# Patient Record
Sex: Male | Born: 1965 | Race: White | Hispanic: No | Marital: Married | Smoking: Current every day smoker
Health system: Southern US, Community
[De-identification: ages and names within clinical notes are randomized; demographics above are authoritative.]

## PROBLEM LIST (undated history)

## (undated) DIAGNOSIS — R7303 Prediabetes: Secondary | ICD-10-CM

## (undated) DIAGNOSIS — T8859XA Other complications of anesthesia, initial encounter: Secondary | ICD-10-CM

## (undated) DIAGNOSIS — T7840XA Allergy, unspecified, initial encounter: Secondary | ICD-10-CM

## (undated) HISTORY — PX: TONSILLECTOMY: SUR1361

## (undated) HISTORY — PX: WISDOM TOOTH EXTRACTION: SHX21

## (undated) HISTORY — DX: Allergy, unspecified, initial encounter: T78.40XA

## (undated) HISTORY — PX: FINGER FRACTURE SURGERY: SHX638

---

## 2011-01-10 ENCOUNTER — Emergency Department: Payer: Self-pay | Admitting: *Deleted

## 2011-06-02 ENCOUNTER — Ambulatory Visit: Payer: Self-pay | Admitting: Family Medicine

## 2011-07-28 ENCOUNTER — Ambulatory Visit: Payer: Self-pay

## 2015-06-05 ENCOUNTER — Inpatient Hospital Stay
Admission: EM | Admit: 2015-06-05 | Discharge: 2015-06-09 | DRG: 494 | Disposition: A | Discharge status: Home / Self Care | Payer: Worker's Compensation | Attending: Orthopedic Surgery | Admitting: Orthopedic Surgery

## 2015-06-05 ENCOUNTER — Inpatient Hospital Stay: Payer: Worker's Compensation

## 2015-06-05 ENCOUNTER — Emergency Department: Payer: Worker's Compensation

## 2015-06-05 DIAGNOSIS — S82201A Unspecified fracture of shaft of right tibia, initial encounter for closed fracture: Secondary | ICD-10-CM | POA: Diagnosis present

## 2015-06-05 DIAGNOSIS — S82401A Unspecified fracture of shaft of right fibula, initial encounter for closed fracture: Secondary | ICD-10-CM | POA: Diagnosis present

## 2015-06-05 DIAGNOSIS — F17200 Nicotine dependence, unspecified, uncomplicated: Secondary | ICD-10-CM | POA: Diagnosis present

## 2015-06-05 DIAGNOSIS — Y9389 Activity, other specified: Secondary | ICD-10-CM

## 2015-06-05 DIAGNOSIS — X58XXXA Exposure to other specified factors, initial encounter: Secondary | ICD-10-CM | POA: Diagnosis present

## 2015-06-05 DIAGNOSIS — M25579 Pain in unspecified ankle and joints of unspecified foot: Secondary | ICD-10-CM | POA: Diagnosis present

## 2015-06-05 DIAGNOSIS — Z419 Encounter for procedure for purposes other than remedying health state, unspecified: Secondary | ICD-10-CM

## 2015-06-05 LAB — COMPREHENSIVE METABOLIC PANEL
ALBUMIN: 4.3 g/dL (ref 3.5–5.0)
ALT: 40 U/L (ref 17–63)
ANION GAP: 9 (ref 5–15)
AST: 32 U/L (ref 15–41)
Alkaline Phosphatase: 83 U/L (ref 38–126)
BUN: 9 mg/dL (ref 6–20)
CO2: 24 mmol/L (ref 22–32)
Calcium: 9 mg/dL (ref 8.9–10.3)
Chloride: 105 mmol/L (ref 101–111)
Creatinine, Ser: 0.78 mg/dL (ref 0.61–1.24)
GFR calc Af Amer: >60 mL/min (ref >60)
GFR calc non Af Amer: >60 mL/min (ref >60)
GLUCOSE: 103 mg/dL — AB (ref 65–99)
POTASSIUM: 3.5 mmol/L (ref 3.5–5.1)
SODIUM: 138 mmol/L (ref 135–145)
Total Bilirubin: 0.4 mg/dL (ref 0.3–1.2)
Total Protein: 7.4 g/dL (ref 6.5–8.1)

## 2015-06-05 LAB — CBC WITH DIFFERENTIAL/PLATELET
BASOS ABS: 0.1 10*3/uL (ref 0–0.1)
BASOS PCT: 1 %
EOS ABS: 0.4 10*3/uL (ref 0–0.7)
Eosinophils Relative: 3 %
HCT: 45.5 % (ref 40.0–52.0)
HEMOGLOBIN: 15.8 g/dL (ref 13.0–18.0)
Lymphocytes Relative: 34 %
Lymphs Abs: 4.8 10*3/uL — ABNORMAL HIGH (ref 1.0–3.6)
MCH: 31.2 pg (ref 26.0–34.0)
MCHC: 34.7 g/dL (ref 32.0–36.0)
MCV: 90.1 fL (ref 80.0–100.0)
MONOS PCT: 7 %
Monocytes Absolute: 1 10*3/uL (ref 0.2–1.0)
NEUTROS PCT: 55 %
Neutro Abs: 7.9 10*3/uL — ABNORMAL HIGH (ref 1.4–6.5)
Platelets: 284 10*3/uL (ref 150–440)
RBC: 5.05 MIL/uL (ref 4.40–5.90)
RDW: 12.9 % (ref 11.5–14.5)
WBC: 14.2 10*3/uL — ABNORMAL HIGH (ref 3.8–10.6)

## 2015-06-05 LAB — APTT: aPTT: 29 seconds (ref 24–36)

## 2015-06-05 LAB — TYPE AND SCREEN
ABO/RH(D): O POS
ANTIBODY SCREEN: NEGATIVE

## 2015-06-05 LAB — PROTIME-INR
INR: 0.98
PROTHROMBIN TIME: 13.2 s (ref 11.4–15.0)

## 2015-06-05 LAB — ABO/RH: ABO/RH(D): O POS

## 2015-06-05 MED ORDER — CEFAZOLIN SODIUM-DEXTROSE 2-3 GM-% IV SOLR
2.0000 g | INTRAVENOUS | Status: AC
  Administered 2015-06-06: 2 g via INTRAVENOUS
  Filled 2015-06-05: qty 50

## 2015-06-05 MED ORDER — ONDANSETRON HCL 4 MG/2ML IJ SOLN
INTRAMUSCULAR | Status: AC
  Filled 2015-06-05: qty 2

## 2015-06-05 MED ORDER — HYDROMORPHONE HCL 1 MG/ML IJ SOLN
1.0000 mg | INTRAMUSCULAR | Status: DC | PRN
  Administered 2015-06-06 (×4): 1 mg via INTRAVENOUS
  Filled 2015-06-05 (×4): qty 1

## 2015-06-05 MED ORDER — HYDROMORPHONE HCL 1 MG/ML IJ SOLN
INTRAMUSCULAR | Status: AC
  Filled 2015-06-05: qty 1

## 2015-06-05 MED ORDER — HYDROMORPHONE HCL 1 MG/ML IJ SOLN
1.0000 mg | Freq: Once | INTRAMUSCULAR | Status: AC
  Administered 2015-06-05: 1 mg via INTRAVENOUS
  Filled 2015-06-05: qty 1

## 2015-06-05 MED ORDER — HYDROMORPHONE HCL 1 MG/ML IJ SOLN
1.0000 mg | Freq: Once | INTRAMUSCULAR | Status: AC
  Administered 2015-06-05: 1 mg via INTRAVENOUS

## 2015-06-05 MED ORDER — ONDANSETRON HCL 4 MG/2ML IJ SOLN
4.0000 mg | Freq: Once | INTRAMUSCULAR | Status: AC
  Administered 2015-06-05: 4 mg via INTRAVENOUS

## 2015-06-05 MED ORDER — OXYCODONE HCL 5 MG PO TABS
10.0000 mg | ORAL_TABLET | ORAL | Status: DC | PRN
  Administered 2015-06-06: 15 mg via ORAL
  Filled 2015-06-05: qty 3

## 2015-06-05 MED ORDER — ACETAMINOPHEN 325 MG PO TABS: 650.0000 mg | ORAL_TABLET | Freq: Four times a day (QID) | ORAL | Status: DC | PRN

## 2015-06-05 MED ORDER — HYDROMORPHONE HCL 1 MG/ML IJ SOLN
INTRAMUSCULAR | Status: AC
  Administered 2015-06-05: 1 mg via INTRAVENOUS
  Filled 2015-06-05: qty 1

## 2015-06-05 MED ORDER — SODIUM CHLORIDE 0.9 % IV SOLN
INTRAVENOUS | Status: DC
  Administered 2015-06-06 (×2): via INTRAVENOUS

## 2015-06-05 NOTE — ED Notes (Signed)
Pt via EMS, pt works for [employer] and was chasing suspect when he climbed over fence and fell into a hole, obvious deformity to R tib/fib

## 2015-06-05 NOTE — ED Provider Notes (Signed)
[facility] Emergency Department Provider Note  ____________________________________________   I have reviewed the triage vital signs and the nursing notes.   HISTORY  Chief Complaint Leg Injury    HPI Tommy [name] is a 50 y.o. male who is healthy, he was chasing a suspect jumped over fence and fell to the hole, he did not injure any part of his body except for his right tib-fib. Did not pass out did not hit his head.He has obvious injury to the right tib-fib region. He is noted. Did get pain medication from EMS. No numbness or weakness. No knee pain. No hip pain.  History reviewed. No pertinent past medical history.  There are no active problems to display for this patient.   History reviewed. No pertinent past surgical history.  No current outpatient prescriptions on file.  Allergies Review of patient's allergies indicates no known allergies.  No family history on file.  Social History Social History  Substance Use Topics  . Smoking status: Current Every Day Smoker  . Smokeless tobacco: None  . Alcohol Use: No    Review of Systems See history of present illness otherwise negative  ____________________________________________   PHYSICAL EXAM:  VITAL SIGNS: ED Triage Vitals  Enc Vitals Group     BP 06/05/15 2112 158/93 mmHg     Pulse Rate 06/05/15 2114 97     Resp 06/05/15 2114 18     Temp 06/05/15 2112 98.1 F (36.7 C)     Temp src --      SpO2 06/05/15 2114 97 %     Weight 06/05/15 2107 192 lb (87.091 kg)     Height 06/05/15 2107 5\' 9"  (1.753 m)     Head Cir --      Peak Flow --      Pain Score 06/05/15 2107 7     Pain Loc --      Pain Edu? --      Excl. in [place]? --     Constitutional: Alert and oriented. Well appearing and obvious discomfort but not otherwise unwell Eyes: Conjunctivae are normal. PERRL. EOMI. Head: Atraumatic. Nose: No congestion/rhinnorhea. Mouth/Throat: Mucous membranes are moist.  Oropharynx  non-erythematous. Neck: No stridor.   Nontender with no meningismus Cardiovascular: Normal rate, regular rhythm. Grossly normal heart sounds.  Good peripheral circulation. Respiratory: Normal respiratory effort.  No retractions. Lungs CTAB. Abdominal: Soft and nontender. No distention. No guarding no rebound Back:  There is no focal tenderness or step off there is no midline tenderness there are no lesions noted. there is no CVA tenderness Musculoskeletal: Obvious deformity to the right lower extremity in the mid shaft of the femur with no tenting of the skin or break in the skin. Strong 2+ distal pulses and DP posterior tibial with good cap refill, compartment a No joint effusions, no DVT signs strong distal pulses no edema Neurologic:  Normal speech and language. No gross focal neurologic deficits are appreciated.  Skin:  Skin is warm, dry and intact. No rash noted. Psychiatric: Mood and affect are normal. Speech and behavior are normal.  ____________________________________________   LABS (all labs ordered are listed, but only abnormal results are displayed)  Labs Reviewed - No data to display ____________________________________________  EKG  I personally interpreted any EKGs ordered by me or triage  ____________________________________________  RADIOLOGY  I reviewed any imaging ordered by me or triage that were performed during my shift ____________________________________________   PROCEDURES  Procedure(s) performed: Splint placement personally supervised  by me or vascular intact with no comp complications afterwards.  Critical Care performed: None  ____________________________________________   INITIAL IMPRESSION / ASSESSMENT AND PLAN / ED COURSE  Pertinent labs & imaging results that were available during my care of the patient were reviewed by me and considered in my medical decision making (see chart for details).  Patient had a nonstick will follow with an  obvious injury. We did give him pain medication, and he is pain is very well controlled at this time. We did splint the patient and he is very much more comfortable at this time. Neurovascular intact. We did discuss with Dr. Christia Reading, of orthopedic surgery who agreed with splitting the patient and who will admit the patient for pain control and operation tomorrow. Patient remains neurovascular intact after splinting. ____________________________________________   FINAL CLINICAL IMPRESSION(S) / ED DIAGNOSES  Final diagnoses:  None      This chart was dictated using voice recognition software.  Despite best efforts to proofread,  errors can occur which can change meaning.     Schuyler Amor, MD 06/05/15 2201

## 2015-06-05 NOTE — ED Notes (Signed)
Completing UDS for workers comp before transport

## 2015-06-05 NOTE — ED Notes (Signed)
Radiology called to request that last ordered RIGHT tib/fib film be discontinued after speaking with Burlene Arnt, MD and new studies being ordered by MD. Order discontinued per [facility]ORB at this time.

## 2015-06-06 ENCOUNTER — Inpatient Hospital Stay: Payer: Worker's Compensation | Admitting: Certified Registered Nurse Anesthetist

## 2015-06-06 ENCOUNTER — Inpatient Hospital Stay: Payer: Worker's Compensation

## 2015-06-06 ENCOUNTER — Encounter
Admission: EM | Disposition: A | Discharge status: Home / Self Care | Payer: Self-pay | Source: Home / Self Care | Attending: Orthopedic Surgery

## 2015-06-06 HISTORY — PX: TIBIA IM NAIL INSERTION: SHX2516

## 2015-06-06 LAB — BASIC METABOLIC PANEL
Anion gap: 12 (ref 5–15)
BUN: 8 mg/dL (ref 6–20)
CALCIUM: 8.7 mg/dL — AB (ref 8.9–10.3)
CHLORIDE: 103 mmol/L (ref 101–111)
CO2: 23 mmol/L (ref 22–32)
CREATININE: 0.85 mg/dL (ref 0.61–1.24)
GFR calc non Af Amer: >60 mL/min (ref >60)
GLUCOSE: 111 mg/dL — AB (ref 65–99)
Potassium: 3.9 mmol/L (ref 3.5–5.1)
Sodium: 138 mmol/L (ref 135–145)

## 2015-06-06 LAB — CBC
HCT: 43.1 % (ref 40.0–52.0)
HEMOGLOBIN: 15 g/dL (ref 13.0–18.0)
MCH: 31.8 pg (ref 26.0–34.0)
MCHC: 34.9 g/dL (ref 32.0–36.0)
MCV: 91 fL (ref 80.0–100.0)
Platelets: 266 10*3/uL (ref 150–440)
RBC: 4.74 MIL/uL (ref 4.40–5.90)
RDW: 12.9 % (ref 11.5–14.5)
WBC: 14.5 10*3/uL — ABNORMAL HIGH (ref 3.8–10.6)

## 2015-06-06 LAB — SURGICAL PCR SCREEN
MRSA, PCR: NEGATIVE
Staphylococcus aureus: NEGATIVE

## 2015-06-06 SURGERY — INSERTION, INTRAMEDULLARY ROD, TIBIA
Anesthesia: Choice | Laterality: Right

## 2015-06-06 MED ORDER — LACTATED RINGERS IV SOLN
INTRAVENOUS | Status: DC | PRN
  Administered 2015-06-06: 10:00:00 via INTRAVENOUS

## 2015-06-06 MED ORDER — OXYCODONE HCL 5 MG PO TABS
15.0000 mg | ORAL_TABLET | ORAL | Status: AC
  Administered 2015-06-06: 15 mg via ORAL
  Filled 2015-06-06: qty 3

## 2015-06-06 MED ORDER — METHOCARBAMOL 1000 MG/10ML IJ SOLN
500.0000 mg | Freq: Four times a day (QID) | INTRAVENOUS | Status: DC | PRN
  Filled 2015-06-06: qty 5

## 2015-06-06 MED ORDER — OXYCODONE HCL 5 MG PO TABS
10.0000 mg | ORAL_TABLET | ORAL | Status: DC | PRN
  Administered 2015-06-06 (×2): 10 mg via ORAL
  Administered 2015-06-07: 15 mg via ORAL
  Administered 2015-06-07: 10 mg via ORAL
  Administered 2015-06-08 – 2015-06-09 (×8): 15 mg via ORAL
  Administered 2015-06-09 (×3): 10 mg via ORAL
  Filled 2015-06-06 (×2): qty 3
  Filled 2015-06-06: qty 10
  Filled 2015-06-06: qty 3
  Filled 2015-06-06: qty 2
  Filled 2015-06-06 (×3): qty 3
  Filled 2015-06-06 (×3): qty 2
  Filled 2015-06-06 (×2): qty 3
  Filled 2015-06-06 (×2): qty 2
  Filled 2015-06-06: qty 15

## 2015-06-06 MED ORDER — ZOLPIDEM TARTRATE 5 MG PO TABS: 5.0000 mg | ORAL_TABLET | Freq: Every evening | ORAL | Status: DC | PRN

## 2015-06-06 MED ORDER — ACETAMINOPHEN 650 MG RE SUPP: 650.0000 mg | Freq: Four times a day (QID) | RECTAL | Status: DC | PRN

## 2015-06-06 MED ORDER — ACETAMINOPHEN 325 MG PO TABS: 650.0000 mg | ORAL_TABLET | Freq: Four times a day (QID) | ORAL | Status: DC | PRN

## 2015-06-06 MED ORDER — KETOROLAC TROMETHAMINE 15 MG/ML IJ SOLN
15.0000 mg | Freq: Four times a day (QID) | INTRAMUSCULAR | Status: AC | PRN
  Administered 2015-06-07 (×3): 15 mg via INTRAVENOUS
  Filled 2015-06-06 (×3): qty 1

## 2015-06-06 MED ORDER — MENTHOL 3 MG MT LOZG
1.0000 | LOZENGE | OROMUCOSAL | Status: DC | PRN
  Filled 2015-06-06: qty 9

## 2015-06-06 MED ORDER — FENTANYL CITRATE (PF) 100 MCG/2ML IJ SOLN
INTRAMUSCULAR | Status: AC
  Administered 2015-06-06: 50 ug via INTRAVENOUS
  Filled 2015-06-06: qty 2

## 2015-06-06 MED ORDER — HYDROMORPHONE HCL 1 MG/ML IJ SOLN
1.0000 mg | INTRAMUSCULAR | Status: DC | PRN
  Administered 2015-06-06 – 2015-06-07 (×3): 1 mg via INTRAVENOUS
  Filled 2015-06-06 (×3): qty 1

## 2015-06-06 MED ORDER — METHOCARBAMOL 500 MG PO TABS
500.0000 mg | ORAL_TABLET | Freq: Four times a day (QID) | ORAL | Status: DC | PRN
  Administered 2015-06-06 – 2015-06-08 (×3): 500 mg via ORAL
  Filled 2015-06-06 (×3): qty 1

## 2015-06-06 MED ORDER — ALUM & MAG HYDROXIDE-SIMETH 200-200-20 MG/5ML PO SUSP
30.0000 mL | ORAL | Status: DC | PRN
  Administered 2015-06-07 – 2015-06-09 (×2): 30 mL via ORAL
  Filled 2015-06-06 (×2): qty 30

## 2015-06-06 MED ORDER — MAGNESIUM CITRATE PO SOLN
1.0000 | Freq: Once | ORAL | Status: DC | PRN
  Filled 2015-06-06: qty 296

## 2015-06-06 MED ORDER — CEFAZOLIN SODIUM-DEXTROSE 2-3 GM-% IV SOLR
2.0000 g | Freq: Four times a day (QID) | INTRAVENOUS | Status: AC
  Administered 2015-06-06: 2 g via INTRAVENOUS
  Filled 2015-06-06 (×3): qty 50

## 2015-06-06 MED ORDER — ROCURONIUM BROMIDE 100 MG/10ML IV SOLN
INTRAVENOUS | Status: DC | PRN
  Administered 2015-06-06: 40 mg via INTRAVENOUS
  Administered 2015-06-06: 10 mg via INTRAVENOUS
  Administered 2015-06-06: 20 mg via INTRAVENOUS
  Administered 2015-06-06: 10 mg via INTRAVENOUS

## 2015-06-06 MED ORDER — ACETAMINOPHEN 10 MG/ML IV SOLN
INTRAVENOUS | Status: AC
  Filled 2015-06-06: qty 100

## 2015-06-06 MED ORDER — SODIUM CHLORIDE 0.9 % IV SOLN
75.0000 mL/h | INTRAVENOUS | Status: DC
  Administered 2015-06-06 – 2015-06-07 (×2): 75 mL/h via INTRAVENOUS

## 2015-06-06 MED ORDER — MIDAZOLAM HCL 2 MG/2ML IJ SOLN
INTRAMUSCULAR | Status: DC | PRN
  Administered 2015-06-06: 2 mg via INTRAVENOUS

## 2015-06-06 MED ORDER — SUGAMMADEX SODIUM 200 MG/2ML IV SOLN
INTRAVENOUS | Status: DC | PRN
  Administered 2015-06-06: 200 mg via INTRAVENOUS

## 2015-06-06 MED ORDER — POLYETHYLENE GLYCOL 3350 17 G PO PACK
17.0000 g | PACK | Freq: Every day | ORAL | Status: DC | PRN
  Administered 2015-06-08: 17 g via ORAL
  Filled 2015-06-06: qty 1

## 2015-06-06 MED ORDER — PHENOL 1.4 % MT LIQD
1.0000 | OROMUCOSAL | Status: DC | PRN
Start: 2015-06-06 — End: 2015-06-09
  Filled 2015-06-06: qty 177

## 2015-06-06 MED ORDER — ONDANSETRON HCL 4 MG/2ML IJ SOLN
INTRAMUSCULAR | Status: DC | PRN
  Administered 2015-06-06: 4 mg via INTRAVENOUS

## 2015-06-06 MED ORDER — DOCUSATE SODIUM 100 MG PO CAPS
100.0000 mg | ORAL_CAPSULE | Freq: Two times a day (BID) | ORAL | Status: DC
  Administered 2015-06-06 – 2015-06-09 (×7): 100 mg via ORAL
  Filled 2015-06-06 (×7): qty 1

## 2015-06-06 MED ORDER — FENTANYL CITRATE (PF) 100 MCG/2ML IJ SOLN
INTRAMUSCULAR | Status: DC | PRN
  Administered 2015-06-06: 100 ug via INTRAVENOUS
  Administered 2015-06-06: 50 ug via INTRAVENOUS
  Administered 2015-06-06: 100 ug via INTRAVENOUS

## 2015-06-06 MED ORDER — BUPIVACAINE HCL (PF) 0.25 % IJ SOLN
INTRAMUSCULAR | Status: AC
  Filled 2015-06-06: qty 30

## 2015-06-06 MED ORDER — BISACODYL 10 MG RE SUPP: 10.0000 mg | Freq: Every day | RECTAL | Status: DC | PRN

## 2015-06-06 MED ORDER — ONDANSETRON HCL 4 MG/2ML IJ SOLN
4.0000 mg | Freq: Four times a day (QID) | INTRAMUSCULAR | Status: DC | PRN
  Administered 2015-06-07 (×2): 4 mg via INTRAVENOUS
  Filled 2015-06-06 (×2): qty 2

## 2015-06-06 MED ORDER — FERROUS SULFATE 325 (65 FE) MG PO TABS
325.0000 mg | ORAL_TABLET | Freq: Three times a day (TID) | ORAL | Status: DC
  Administered 2015-06-06 – 2015-06-09 (×8): 325 mg via ORAL
  Filled 2015-06-06 (×9): qty 1

## 2015-06-06 MED ORDER — DIAZEPAM 5 MG/ML IJ SOLN
5.0000 mg | INTRAMUSCULAR | Status: DC | PRN
  Administered 2015-06-06 – 2015-06-08 (×3): 5 mg via INTRAVENOUS
  Filled 2015-06-06 (×4): qty 2

## 2015-06-06 MED ORDER — LIDOCAINE HCL (CARDIAC) 20 MG/ML IV SOLN
INTRAVENOUS | Status: DC | PRN
  Administered 2015-06-06: 100 mg via INTRAVENOUS

## 2015-06-06 MED ORDER — HYDROMORPHONE HCL 1 MG/ML IJ SOLN
1.0000 mg | Freq: Once | INTRAMUSCULAR | Status: AC
Start: 2015-06-06 — End: 2015-06-06
  Administered 2015-06-06: 1 mg via INTRAVENOUS
  Filled 2015-06-06: qty 1

## 2015-06-06 MED ORDER — ENOXAPARIN SODIUM 30 MG/0.3ML ~~LOC~~ SOLN
30.0000 mg | Freq: Two times a day (BID) | SUBCUTANEOUS | Status: DC
  Administered 2015-06-06 – 2015-06-09 (×6): 30 mg via SUBCUTANEOUS
  Filled 2015-06-06 (×6): qty 0.3

## 2015-06-06 MED ORDER — DEXAMETHASONE SODIUM PHOSPHATE 10 MG/ML IJ SOLN
INTRAMUSCULAR | Status: DC | PRN
  Administered 2015-06-06: 10 mg via INTRAVENOUS

## 2015-06-06 MED ORDER — FENTANYL CITRATE (PF) 100 MCG/2ML IJ SOLN
25.0000 ug | INTRAMUSCULAR | Status: DC | PRN
  Administered 2015-06-06 (×2): 50 ug via INTRAVENOUS

## 2015-06-06 MED ORDER — SUCCINYLCHOLINE CHLORIDE 20 MG/ML IJ SOLN
INTRAMUSCULAR | Status: DC | PRN
  Administered 2015-06-06: 100 mg via INTRAVENOUS

## 2015-06-06 MED ORDER — SENNA 8.6 MG PO TABS
1.0000 | ORAL_TABLET | Freq: Two times a day (BID) | ORAL | Status: DC
  Administered 2015-06-06 – 2015-06-09 (×7): 8.6 mg via ORAL
  Filled 2015-06-06 (×7): qty 1

## 2015-06-06 MED ORDER — PROPOFOL 10 MG/ML IV BOLUS
INTRAVENOUS | Status: DC | PRN
  Administered 2015-06-06: 200 mg via INTRAVENOUS

## 2015-06-06 MED ORDER — DIAZEPAM 5 MG/ML IJ SOLN
5.0000 mg | Freq: Once | INTRAMUSCULAR | Status: AC
  Administered 2015-06-06: 5 mg via INTRAVENOUS
  Filled 2015-06-06: qty 2

## 2015-06-06 MED ORDER — ONDANSETRON HCL 4 MG PO TABS: 4.0000 mg | ORAL_TABLET | Freq: Four times a day (QID) | ORAL | Status: DC | PRN

## 2015-06-06 MED ORDER — NEOMYCIN-POLYMYXIN B GU 40-200000 IR SOLN
Status: DC | PRN
Start: 2015-06-06 — End: 2015-06-06
  Administered 2015-06-06: 4 mL

## 2015-06-06 MED ORDER — DIAZEPAM 5 MG/ML IJ SOLN
5.0000 mg | INTRAMUSCULAR | Status: DC | PRN
  Administered 2015-06-06: 5 mg via INTRAVENOUS
  Filled 2015-06-06: qty 2

## 2015-06-06 MED ORDER — PROMETHAZINE HCL 25 MG/ML IJ SOLN: 6.2500 mg | INTRAMUSCULAR | Status: DC | PRN

## 2015-06-06 MED ORDER — BUPIVACAINE HCL (PF) 0.25 % IJ SOLN
INTRAMUSCULAR | Status: DC | PRN
  Administered 2015-06-06: 30 mL

## 2015-06-06 MED ORDER — ACETAMINOPHEN 10 MG/ML IV SOLN
INTRAVENOUS | Status: DC | PRN
  Administered 2015-06-06: 1000 mg via INTRAVENOUS

## 2015-06-06 SURGICAL SUPPLY — 53 items
BANDAGE ELASTIC 4 LF NS (GAUZE/BANDAGES/DRESSINGS) ×3 IMPLANT
BANDAGE ELASTIC 6 LF NS (GAUZE/BANDAGES/DRESSINGS) ×3 IMPLANT
BIT DRILL 3.8X6 NS (BIT) ×3 IMPLANT
BIT DRILL 4.4 NS (BIT) ×3 IMPLANT
BIT DRILL CAL 3.2 LONG (BIT) IMPLANT
BIT DRILL CAL 3.2MM LONG (BIT)
CANISTER SUCT 1200ML W/VALVE (MISCELLANEOUS) ×3 IMPLANT
DRAPE C-ARM XRAY 36X54 (DRAPES) ×3 IMPLANT
DRAPE C-ARMOR (DRAPES) ×3 IMPLANT
DRAPE SHEET LG 3/4 BI-LAMINATE (DRAPES) ×6 IMPLANT
DURAPREP 26ML APPLICATOR (WOUND CARE) ×6 IMPLANT
ELECT REM PT RETURN 9FT ADLT (ELECTROSURGICAL) ×3
ELECTRODE REM PT RTRN 9FT ADLT (ELECTROSURGICAL) ×1 IMPLANT
GAUZE PETRO XEROFOAM 1X8 (MISCELLANEOUS) ×6 IMPLANT
GAUZE SPONGE 4X4 12PLY STRL (GAUZE/BANDAGES/DRESSINGS) ×6 IMPLANT
GLOVE BIO SURGEON STRL SZ8 (GLOVE) ×3 IMPLANT
GLOVE BIOGEL PI IND STRL 9 (GLOVE) ×1 IMPLANT
GLOVE BIOGEL PI INDICATOR 9 (GLOVE) ×2
GLOVE INDICATOR 8.0 STRL GRN (GLOVE) ×3 IMPLANT
GLOVE SURG 9.0 ORTHO LTXF (GLOVE) ×3 IMPLANT
GLOVE SURG XRAY 8.0 LX (GLOVE) ×3 IMPLANT
GOWN STRL REUS TWL 2XL XL LVL4 (GOWN DISPOSABLE) ×3 IMPLANT
GOWN STRL REUS W/ TWL LRG LVL3 (GOWN DISPOSABLE) ×1 IMPLANT
GOWN STRL REUS W/TWL LRG LVL3 (GOWN DISPOSABLE) ×2
GUIDEPIN 3.2X17.5 THRD DISP (PIN) ×3 IMPLANT
GUIDEWIRE BALL NOSE 80CM (WIRE) ×3 IMPLANT
HEMOVAC 400CC 10FR (MISCELLANEOUS) IMPLANT
KIT RM TURNOVER STRD PROC AR (KITS) ×3 IMPLANT
NAIL TIBIAL 10MMX36CM (Nail) ×3 IMPLANT
NEEDLE FILTER BLUNT 18X 1/2SAF (NEEDLE) ×2
NEEDLE FILTER BLUNT 18X1 1/2 (NEEDLE) ×1 IMPLANT
NS IRRIG 1000ML POUR BTL (IV SOLUTION) ×3 IMPLANT
PACK TOTAL KNEE (MISCELLANEOUS) ×3 IMPLANT
PAD ABD DERMACEA PRESS 5X9 (GAUZE/BANDAGES/DRESSINGS) ×12 IMPLANT
PADDING CAST 6X4YD NS (MISCELLANEOUS) ×4
PADDING CAST BLEND 4X4 NS (MISCELLANEOUS) ×6 IMPLANT
PADDING CAST COTTON 6X4 NS (MISCELLANEOUS) ×2 IMPLANT
REAMER ROD DEEP FLUTE 2.5X950 (INSTRUMENTS) ×3 IMPLANT
SCREW ACECAP 40MM (Screw) ×3 IMPLANT
SCREW PROXIMAL DEPUY (Screw) ×2 IMPLANT
SCREW PRXML FT 45X5.5XLCK NS (Screw) ×1 IMPLANT
SET MONITOR QUICK PRESSURE (MISCELLANEOUS) ×6 IMPLANT
SPLINT CAST 1 STEP 4X30 (MISCELLANEOUS) ×6 IMPLANT
SPLINT CAST 1 STEP 5X30 WHT (MISCELLANEOUS) ×6 IMPLANT
STAPLER SKIN PROX 35W (STAPLE) ×3 IMPLANT
SUT ETHILON 4-0 (SUTURE) ×2
SUT ETHILON 4-0 FS2 18XMFL BLK (SUTURE) ×1
SUT MNCRL AB 3-0 PS2 27 (SUTURE) ×3 IMPLANT
SUT VIC AB 0 CT2 27 (SUTURE) ×3 IMPLANT
SUT VIC AB 2-0 CT2 27 (SUTURE) ×3 IMPLANT
SUTURE ETHLN 4-0 FS2 18XMF BLK (SUTURE) ×1 IMPLANT
SYRINGE 10CC LL (SYRINGE) ×3 IMPLANT
TRAY FOLEY CATH SILVER 16FR LF (SET/KITS/TRAYS/PACK) ×3 IMPLANT

## 2015-06-06 NOTE — Anesthesia Preprocedure Evaluation (Signed)
Anesthesia Evaluation    Airway Mallampati: III  TM Distance: <3 FB     Dental   Pulmonary Current Smoker,           Cardiovascular      Neuro/Psych    GI/Hepatic   Endo/Other    Renal/GU      Musculoskeletal   Abdominal   Peds  Hematology   Anesthesia Other Findings   Reproductive/Obstetrics                             Anesthesia Physical Anesthesia Plan  ASA: II  Anesthesia Plan:    Post-op Pain Management:    Induction:   Airway Management Planned:   Additional Equipment:   Intra-op Plan:   Post-operative Plan:   Informed Consent:   Plan Discussed with:   Anesthesia Plan Comments:         Anesthesia Quick Evaluation

## 2015-06-06 NOTE — OR Nursing (Signed)
Patient arrived in SDS writhing in pain.  Face flushed, itching and patient requesting something to comfort him.  Once the surgical staff interviewed patient, he was given sedation/pain meds.  Patient 02 went to 83%. O2 at 3 liters nasal prong initiated with an immediate improvement to 94%. Patient sound asleep when leaving SDS. New IV started in right arm as patient was unable to keep arm straight for fluids, etc.  Dressing was loose also. Removed.

## 2015-06-06 NOTE — Progress Notes (Signed)
Pt. Arrived to unit in severe pain. Dilaudid and oxycodone administered per MAR. Dr. Mack Guise notified and an additional milligram of dilaudid ordered and administered. Dr. Mack Guise came to unit to see pt. And additional orders written. Meds administered per MAR. Ice applied to fractured extremity. Pt. Resting comfortably at this time. Will continue to monitor.

## 2015-06-06 NOTE — H&P (Signed)
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 He jumped a fence while in pursuit landing in a hole causing injury to the right lower leg. He denies other injuries. Patient is currently having severe pain in the right leg. Patient's nurses contacted me about his pain not responding to oxycodone and Dilaudid. She reports she was having severe pain upon arrival on the floor. Patient states that his pain goes in waves of intensity similar to spasms. He denies any numbness or tingling in the right lower extremity. His wife is at the bedside.  History reviewed. No pertinent past medical history. History reviewed. No pertinent past surgical history. Social History   Social History  . Marital Status: Married    Spouse Name: N/A  . Number of Children: N/A  . Years of Education: N/A   Social History Main Topics  . Smoking status: Current Every Day Smoker  . Smokeless tobacco: None  . Alcohol Use: No  . Drug Use: None  . Sexual Activity: Not Asked   Other Topics Concern  . None   Social History Narrative   History reviewed. No pertinent family history. No Known Allergies Prior to Admission medications   Not on File     Positive ROS: All other systems have been reviewed and were otherwise negative with the exception of those mentioned in the HPI and as above.  Physical Exam: General: Alert, Patient in pain. Skin: Skin intact, no lesions within the operative field. Neurologic: Sensation intact distally  MUSCULOSKELETAL: Right lower extremity: Patient's right lower extremity is elevated above the level of his heart on multiple pillows. Patient's bandages were opened with trauma shears so his skin could be visualized. An AO splint is in place. This was also loosened so the patient's skin and leg  compartments could be examined. Patient's skin is intact. His compartments, including anterior, lateral and posterior, remain soft and compressible. He has a 2+ palpable dorsalis pedis pulse. Patient can flex and extend his toes actively. He does not have significant pain with passive stretch of his toes.  Assessment: Right closed tibia and fibula fracture  Plan: I examined the patient for signs of compartment syndrome this evening. His bandage is removed. His compartments remain soft the knee remains neurovascularly intact. There are currently no signs of compartment syndrome. Patient has been given orders for additional narcotic pain medication as well as IV Valium for muscle spasms. He will continue to elevate his right lower extremity and more ice will be applied to his right lower leg. Patient is scheduled for surgery later this morning. I'm recommending intramedullary fixation for his right displaced, spiral tibial fracture. He will continue to receive close neurovascular monitoring. Patient and his wife understood and agreed with this plan.   Thornton Park, MD   06/06/2015 3:57 AM

## 2015-06-06 NOTE — Transfer of Care (Signed)
Immediate Anesthesia Transfer of Care Note  Patient: Tommy [name]  Procedure(s) Performed: Procedure(s): INTRAMEDULLARY (IM) NAIL TIBIAL (Right)  Patient Location: PACU  Anesthesia Type:General  Level of Consciousness: sedated  Airway & Oxygen Therapy: Patient Spontanous Breathing and Patient connected to face mask oxygen  Post-op Assessment: Report given to RN and Post -op Vital signs reviewed and stable  Post vital signs: Reviewed and stable  Last Vitals:  Filed Vitals:   06/06/15 0511 06/06/15 0850  BP: 137/78 135/89  Pulse: 85 84  Temp: 36.4 C 37.2 C  Resp: 18 16    Complications: No apparent anesthesia complications

## 2015-06-06 NOTE — Progress Notes (Signed)
Pt. Still in extreme pain after receiving all pain meds due at this time. Dr. Mack Guise ordered an additional 1mg  of dilaudid.

## 2015-06-06 NOTE — Progress Notes (Signed)
Pt. Having severe muscle spasms. Additional dose of valium 5mg  IV ordered per Dr. Mack Guise.

## 2015-06-06 NOTE — Care Management (Signed)
Patient was not in room when I rounded. List of home health agencies left at bedside. Message left for workers' comp [phone] to discuss patient discharge need. RNCM to continue to follow.

## 2015-06-06 NOTE — Progress Notes (Addendum)
Subjective:  POST OP CHECK:  Patient reports right leg pain as marked.  However clinically she appears more comfortable than he was overnight. Patient is talking to his family and friends in the room. He denies numbness or tingling in the right lower extremity.  Objective:   VITALS:   Filed Vitals:   06/06/15 1356 06/06/15 1439 06/06/15 1506 06/06/15 1631  BP: 123/79 133/81 123/74 120/69  Pulse: 77 80 82 83  Temp: 97.9 F (36.6 C) 98.2 F (36.8 C) 98.2 F (36.8 C) 98.4 F (36.9 C)  TempSrc:  Oral Oral Oral  Resp: 12 18 18 18   Height:      Weight:      SpO2: 96% 95% 92% 94%    PHYSICAL EXAM:  Right lower extremity: Patient's dressing and bandages are clean dry and intact. His right lower extremity is elevated on pillows. His toes are well-perfused. He has intact sensation to light touch in all 5 toes. He can flex and extend his toes gently without significant pain and demonstrates no significant pain with passive stretch of his toes on the right foot.   LABS  Results for orders placed or performed during the hospital encounter of 06/05/15 (from the past 24 hour(s))  Protime-INR     Status: None   Collection Time: 06/05/15  9:08 PM  Result Value Ref Range   Prothrombin Time 13.2 11.4 - 15.0 seconds   INR 0.98   CBC WITH DIFFERENTIAL     Status: Abnormal   Collection Time: 06/05/15  9:08 PM  Result Value Ref Range   WBC 14.2 (H) 3.8 - 10.6 K/uL   RBC 5.05 4.40 - 5.90 MIL/uL   Hemoglobin 15.8 13.0 - 18.0 g/dL   HCT 45.5 40.0 - 52.0 %   MCV 90.1 80.0 - 100.0 fL   MCH 31.2 26.0 - 34.0 pg   MCHC 34.7 32.0 - 36.0 g/dL   RDW 12.9 11.5 - 14.5 %   Platelets 284 150 - 440 K/uL   Neutrophils Relative % 55 %   Neutro Abs 7.9 (H) 1.4 - 6.5 K/uL   Lymphocytes Relative 34 %   Lymphs Abs 4.8 (H) 1.0 - 3.6 K/uL   Monocytes Relative 7 %   Monocytes Absolute 1.0 0.2 - 1.0 K/uL   Eosinophils Relative 3 %   Eosinophils Absolute 0.4 0 - 0.7 K/uL   Basophils Relative 1 %   Basophils  Absolute 0.1 0 - 0.1 K/uL  Comprehensive metabolic panel     Status: Abnormal   Collection Time: 06/05/15  9:08 PM  Result Value Ref Range   Sodium 138 135 - 145 mmol/L   Potassium 3.5 3.5 - 5.1 mmol/L   Chloride 105 101 - 111 mmol/L   CO2 24 22 - 32 mmol/L   Glucose, Bld 103 (H) 65 - 99 mg/dL   BUN 9 6 - 20 mg/dL   Creatinine, Ser 0.78 0.61 - 1.24 mg/dL   Calcium 9.0 8.9 - 10.3 mg/dL   Total Protein 7.4 6.5 - 8.1 g/dL   Albumin 4.3 3.5 - 5.0 g/dL   AST 32 15 - 41 U/L   ALT 40 17 - 63 U/L   Alkaline Phosphatase 83 38 - 126 U/L   Total Bilirubin 0.4 0.3 - 1.2 mg/dL   GFR calc non Af Amer >60 >60 mL/min   GFR calc Af Amer >60 >60 mL/min   Anion gap 9 5 - 15  APTT     Status: None  Collection Time: 06/05/15  9:08 PM  Result Value Ref Range   aPTT 29 24 - 36 seconds  ABO/Rh     Status: None   Collection Time: 06/05/15  9:46 PM  Result Value Ref Range   ABO/RH(D) O POS   Type and screen If not already done in ED     Status: None   Collection Time: 06/05/15 10:00 PM  Result Value Ref Range   ABO/RH(D) O POS    Antibody Screen NEG    Sample Expiration 06/08/2015   Surgical pcr screen     Status: None   Collection Time: 06/06/15  2:40 AM  Result Value Ref Range   MRSA, PCR NEGATIVE NEGATIVE   Staphylococcus aureus NEGATIVE NEGATIVE  CBC     Status: Abnormal   Collection Time: 06/06/15  5:23 AM  Result Value Ref Range   WBC 14.5 (H) 3.8 - 10.6 K/uL   RBC 4.74 4.40 - 5.90 MIL/uL   Hemoglobin 15.0 13.0 - 18.0 g/dL   HCT 43.1 40.0 - 52.0 %   MCV 91.0 80.0 - 100.0 fL   MCH 31.8 26.0 - 34.0 pg   MCHC 34.9 32.0 - 36.0 g/dL   RDW 12.9 11.5 - 14.5 %   Platelets 266 150 - 440 K/uL  Basic metabolic panel     Status: Abnormal   Collection Time: 06/06/15  5:23 AM  Result Value Ref Range   Sodium 138 135 - 145 mmol/L   Potassium 3.9 3.5 - 5.1 mmol/L   Chloride 103 101 - 111 mmol/L   CO2 23 22 - 32 mmol/L   Glucose, Bld 111 (H) 65 - 99 mg/dL   BUN 8 6 - 20 mg/dL   Creatinine,  Ser 0.85 0.61 - 1.24 mg/dL   Calcium 8.7 (L) 8.9 - 10.3 mg/dL   GFR calc non Af Amer >60 >60 mL/min   GFR calc Af Amer >60 >60 mL/min   Anion gap 12 5 - 15    Dg Tibia/fibula Right  06/06/2015  ADDENDUM REPORT: 06/06/2015 12:58 ADDENDUM: The report should read ORIF right tibia with good anatomic alignment. Hardware intact. Proximal fibular fracture again noted. Electronically Signed   By: Marcello Moores  Register   On: 06/06/2015 12:58  06/06/2015  CLINICAL DATA:  ORIF. EXAM: DG C-ARM 61-120 MIN COMPARISON:  06/05/2015. FINDINGS: ORIF left tibia good anatomic alignment. Hardware intact. Proximal fibular fracture noted. IMPRESSION: ORIF left tibia with good anatomic alignment. Proximal fibular fracture noted. 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 FINDINGS: There are fractures of the right tibia and fibula. The tibia fracture is spiral,, without significant comminution, along the mid to distal shaft. It is mildly displaced, with the distal fracture component displacing posteriorly by 11 mm. Fractures also mildly overlapped/ foreshortened, by 1 cm. The fibular fracture is of the proximal fibular shaft extending to the metaphysis. It is oblique, mildly comminuted and mildly displaced posteriorly and laterally by 5 mm, as well as foreshortened/overlapped, also by 5 mm. No other fractures.  Knee and ankle joints are normally aligned. IMPRESSION: Fractures of the right tibia, along the mid to distal shaft, and right fibula, across the proximal metaphysis and proximal shaft, mildly  displaced as described. Electronically Signed   By: Lajean Manes M.D.   On: 06/05/2015 21:32   Dg Ankle 2 Views Right  06/05/2015  CLINICAL DATA:  [place] [address]  into hole, with obvious deformity of the right lower leg. Initial encounter. EXAM: RIGHT ANKLE - 2 VIEW COMPARISON:  None. FINDINGS: There is mildly worsened lateral displacement and slightly worsened shortening at the tibial diaphyseal fracture site, though the degree of posterior displacement is improved. Slight posterior tilt is noted. There appears to be a minimally displaced fracture of the posterior malleolus, not well characterized on the prior study. The fracture of the proximal fibula is not well seen on this study. The soft tissues are difficult to fully assess due to the overlying splint. No definite open wound is seen, though this would be better assessed on clinical exam. IMPRESSION: 1. Mildly worsened lateral displacement and slightly worsened shortening at the tibial diaphyseal fracture site, though the degree of posterior displacement is improved. Slight posterior tilt seen. 2. Minimally displaced fracture of the posterior malleolus, not well characterized on the prior study. 3. Fracture of the proximal fibula is not well seen on this study. Electronically Signed   By: Garald Balding M.D.   On: 06/05/2015 22:30   Dg Tibia/fibula Right Port  06/06/2015  CLINICAL DATA:  Right tibia rod placement, tibial and fibular fractures. EXAM: PORTABLE RIGHT TIBIA AND FIBULA - 2 VIEW COMPARISON:  06/05/2015 and intraoperative radiographs of 06/06/2015 FINDINGS: Fiberglass splint in place. Antegrade intramedullary nail in the tibia with single proximal and single distal interlocking screws. The rod traverses the dominant spiral tibial fracture, which has near-anatomic alignment and positioning. On the frontal projection than include the lower tibia, concern is raised for a subtle linear nondisplaced longitudinal fracture extending in the vicinity of the distal rod down to the level of the interlocking nail. This could represent a longitudinal nondisplaced component of the fracture and may have been present prior  to IM nail placement, although is considered highly subtle both on this postoperative study and on preoperative radiographs. Oblique proximal fibular metadiaphyseal fracture observed. IMPRESSION: 1. Long tibial antegrade intramedullary nail with proximal and distal interlocking screws traversing the spiral fracture. There is a subtle linear lucency in the distal tibial metadiaphysis extending to the level of the interlocking screw which could represent a linear nondisplaced longitudinal fracture of the tibia, versus a prominent nutrient foramen. Assuming this is indeed a nondisplaced fracture, it likely occurred at the time of injury rather than operatively. 2. Proximal fibular oblique metadiaphyseal fracture. Electronically Signed   By: Van Clines M.D.   On: 06/06/2015 13:52   Dg C-arm 61-120 Min  06/06/2015  ADDENDUM REPORT: 06/06/2015 12:48 ADDENDUM: The report should read ORIF right tibia with good anatomic alignment. Hardware intact. Proximal fibular fracture again noted. Electronically Signed   By: Marcello Moores  Register   On: 06/06/2015 12:48  06/06/2015  CLINICAL DATA:  ORIF. EXAM: DG C-ARM 61-120 MIN COMPARISON:  06/05/2015. FINDINGS: ORIF left tibia good anatomic alignment. Hardware intact. Proximal fibular fracture noted. IMPRESSION: ORIF left tibia with good anatomic alignment. Proximal fibular fracture noted. Electronically Signed: By: Marcello Moores  Register On: 06/06/2015 11:43    Assessment/Plan: Day of Surgery   Active Problems:   Fracture of right tibia and fibula  Patient is stable postop. He continues to have pain in the right lower extremity, but this does not appear to be as severe as it was overnight. Clinically he has no signs of compartment syndrome. His compartment  pressures intraoperatively were below 30 and clinically his leg compartments were soft. Patient will continue strict elevation and icing of the right lower extremity overnight. He should continue to get frequent  neurovascular checks. Patient is on oxycodone, Dilaudid for pain control, Valium for muscle spasms and is also prescribed Toradol for postop inflammation. He will advance diet as tolerated. He will complete 24 hours of postop antibiotics. Physical therapy evaluation will begin tomorrow.  I have personally reviewed the post-operative xrays which demonstrated the tibia and fibula fractures are well aligned.  The hardware is well positioned.  No post-op complications seen.  Thornton Park , MD 06/06/2015, 5:07 PM

## 2015-06-06 NOTE — Progress Notes (Signed)
Pt. Refused foley. Wants to have it placed in surgery.

## 2015-06-06 NOTE — Anesthesia Procedure Notes (Signed)
Procedure Name: Intubation Date/Time: 06/06/2015 9:40 AM Performed by: Nelda Marseille Pre-anesthesia Checklist: Patient identified, Patient being monitored, Timeout performed, Emergency Drugs available and Suction available Patient Re-evaluated:Patient Re-evaluated prior to inductionOxygen Delivery Method: Circle System Utilized Preoxygenation: Pre-oxygenation with 100% oxygen Intubation Type: IV induction Ventilation: Mask ventilation without difficulty Laryngoscope Size: Mac and 3 Grade View: Grade III Tube type: Oral Tube size: 7.5 mm Number of attempts: 1 Airway Equipment and Method: Stylet Placement Confirmation: ETT inserted through vocal cords under direct vision,  positive ETCO2 and breath sounds checked- equal and bilateral Secured at: 21 cm Tube secured with: Tape Dental Injury: Teeth and Oropharynx as per pre-operative assessment

## 2015-06-06 NOTE — Anesthesia Postprocedure Evaluation (Signed)
Anesthesia Post Note  Patient: [name]  Procedure(s) Performed: Procedure(s) (LRB): INTRAMEDULLARY (IM) NAIL TIBIAL (Right)  Patient location during evaluation: PACU Anesthesia Type: General Level of consciousness: awake and alert Pain management: pain level controlled Vital Signs Assessment: post-procedure vital signs reviewed and stable Respiratory status: spontaneous breathing, nonlabored ventilation, respiratory function stable and patient connected to nasal cannula oxygen Cardiovascular status: blood pressure returned to baseline and stable Postop Assessment: no signs of nausea or vomiting Anesthetic complications: no    Last Vitals:  Filed Vitals:   06/06/15 1439 06/06/15 1506  BP: 133/81 123/74  Pulse: 80 82  Temp: 36.8 C 36.8 C  Resp: 18 18    Last Pain:  Filed Vitals:   06/06/15 1506  PainSc: 8                  Martha Clan

## 2015-06-06 NOTE — Op Note (Signed)
DATE OF SURGERY:  06/06/2015  TIME: 12:53 PM  PATIENT NAME:  Tommy Tommy  AGE: 50 y.o.  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 He was brought into the ER and then admitted to orthopaedics for evaluation and management of the [name] [name].    The risks, benefits and alternatives were discussed with the patient and their family.  The risks include but are not limited to infection, bleeding, nerve or blood vessel injury, malunion, nonunion, hardware prominence, hardware failure, change in leg lengths or lower extremity rotation need for further surgery. Medical risks include but are not limited to DVT and pulmonary embolism, myocardial infarction, stroke, pneumonia, respiratory failure and death. The patient and their family understood these risks and wished to proceed with surgery.  OPERATIVE PROCEDURE:  The patient was brought to the operating room and placed in the supine position on the radiolucent OR table.. General anesthesia was administered. He was prepped and draped in a sterile fashion.  A closed reduction was performed using a tibial distractor under C-arm guidance.  The [name] reduction was confirmed on both AP and lateral views. A time out was performed to verify the patient's name, date of birth, medical record number, correct site of surgery correct procedure to be performed. It was also used to verify the patient received antibiotics and that appropriate instruments, implants and radiographic studies were available in the room. Once all in  attendance were in agreement, the case began.   A mid line incision was made extending from the tibial tubercle to the inferior pole of the patella. A threaded guidepin was then inserted into the proximal [name] with a drill. A starting awl was then placed over this and drill pin and an entry hole was made in the proximal [name]. The threaded guidepin was removed and replaced with a ball tip guidewire which was advanced down the tibial shaft and across the [name] site into the distal [name]. The length of the interosseous guidewire was measured with a depth gauge.  The position of this ball tip guidewire was confirmed on AP and lateral C-arm images. Sequential reamers were then used over the ball-tipped guidewire until sufficient cortical chatter was achieved. A nail diameter 1-1/2 mm smaller than the last reamer was selected.  The nail was then assembled onto the inserting jig and advanced into position within the tibial shaft. Its final position was confirmed on AP and lateral C-arm images both proximally and distally.   The drill sleeve for the proximal interlocking screw was then placed through the [facility] guide arm. A small stab incision was made to allow the drill guide to approximate the lateral cortex of the [name]. The drill for the interlocking screw was then advanced bicortically. The depth of this drill was measured. An interlocking screw with the length measured was then inserted by hand through the guide arm.   Distal interlocking screws were then placed using a perfect circle technique. Once the position of the distal interlocking screw hole was identified with C-arm, a small stab incision was made to allow the drill to approximate  the medial cortex of the [name]. The drill for the interlocking screw was then advanced bicortically. The diameter of the [name] was measured through this drill hole using a depth gauge.. An interlocking screw with the length measured was then inserted by hand. Once all  interlocking screws were in position, final C-arm images of the intramedullary construct were taken in both the AP and lateral planes including the proximal and distal ends of the [name] as well as the [name] site. The insertion arm for the tibial nail was then removed.   The wounds were irrigated copiously and closed with 0 Vicryl for closure of the deep fascia and 2-0 Vicryl for his subcutaneous closure. The skin was approximated with staples. A dry sterile dressing was applied. The compartment pressures were measured in the Tommy leg at the conclusion of the case.  The lateral and anterior compartments were 23 mmHG and the posterior compartment was measured to be 24 mmHg.  Clinically, the compartments were compressible and only moderately swollen.  The foot was well perfused.  The patient had strong palpable dorsalis pedis and posterior tibial pulses.  An AO splint was then applied to the operative leg.  I was scrubbed and present the entire case and all sharp and instrument counts were correct at the conclusion of the case. Patient was awoken and transferred to hospital bed and brought to PACU in stable condition. I spoke with the patient's family in the postoperatively to let them know the case had gone without complication and the patient was stable in the recovery room.  He will be non-weightbearing on the Tommy lower extremity and will begin physical and occupational therapy tomorrow. The patient will be started on medical DVT prophylaxis tomorrow. He will continue to have close neurovascular monitoring and will continue to keep his Tommy lower leg elevated with ice applied to reduce post-op swelling.   Tommy Gaul, MD

## 2015-06-07 LAB — BASIC METABOLIC PANEL
Anion gap: 8 (ref 5–15)
BUN: 9 mg/dL (ref 6–20)
CALCIUM: 8.7 mg/dL — AB (ref 8.9–10.3)
CO2: 27 mmol/L (ref 22–32)
CREATININE: 0.74 mg/dL (ref 0.61–1.24)
Chloride: 103 mmol/L (ref 101–111)
Glucose, Bld: 128 mg/dL — ABNORMAL HIGH (ref 65–99)
POTASSIUM: 3.9 mmol/L (ref 3.5–5.1)
SODIUM: 138 mmol/L (ref 135–145)

## 2015-06-07 LAB — CBC
HCT: 36.4 % — ABNORMAL LOW (ref 40.0–52.0)
Hemoglobin: 12.8 g/dL — ABNORMAL LOW (ref 13.0–18.0)
MCH: 31.9 pg (ref 26.0–34.0)
MCHC: 35.3 g/dL (ref 32.0–36.0)
MCV: 90.5 fL (ref 80.0–100.0)
PLATELETS: 228 10*3/uL (ref 150–440)
RBC: 4.02 MIL/uL — AB (ref 4.40–5.90)
RDW: 12.6 % (ref 11.5–14.5)
WBC: 15.5 10*3/uL — AB (ref 3.8–10.6)

## 2015-06-07 NOTE — Clinical Social Work Note (Signed)
Clinical Social Worker consulted for [employer]. PT is recommending HHPT. CSW will updated RNCM, who will follow for discharge planning needs. CSW is signing off as no further needs identified. Please reconsult if a need arises prior to discharge.   Darden Dates, MSW, LCSW Clinical Social Worker [phone]

## 2015-06-07 NOTE — Evaluation (Signed)
Physical Therapy Evaluation Patient Details Name: Tommy [name] MRN: [id number] DOB: May 13, 1965 Today's Date: 06/07/2015   History of Present Illness  Pt here with R tib/fib fracture and ORIF.  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   Clinical Impression  Pt did well with PT but did describe how moving the leg and ambulating were more difficult than he expected.  Pt with pain at rest, but has considerable increased pain with any movement of mobility.  He shows good effort t/o session and is eager to do what he can do get out of the hospital.  Pt will need stair training, may try that his afternoon.  Pt shows good effort with light/limited in bed exercises with both R and L LE 8-10 minutes.      Follow Up Recommendations Home health PT    Equipment Recommendations       Recommendations for Other Services       Precautions / Restrictions Precautions Precautions: Fall Restrictions RLE Weight Bearing: Non weight bearing      Mobility  Bed Mobility Overal bed mobility: Modified Independent             General bed mobility comments: Pt slow to get to EOB, needing heavy UE assist to move R leg in bed  Transfers Overall transfer level: Modified independent Equipment used: Crutches             General transfer comment: Pt needing cues and CGA to get to standing on 2 attempts.  Pt has no LOBs but does show some unsteadiness and lack of confidence.   Ambulation/Gait Ambulation/Gait assistance: Min guard Ambulation Distance (Feet): 5 Feet Assistive device: Crutches       General Gait Details: hop-to gait pattern with crutches.  Pt indicates that it was "tougher than you think" but overall was safe and able to get to the recliner.  Pt with increased R leg pain with the effort and has some fatigue but overall was safe and able to maintain NWBing.  Stairs            Wheelchair Mobility    Modified Rankin  (Stroke Patients Only)       Balance                                             Pertinent Vitals/Pain Pain Assessment: 0-10 Pain Score: 4  Pain Location: pain increases signficant with any movement and with gravity dependent positions    Home Living Family/patient expects to be discharged to:: Private residence Living Arrangements: Spouse/significant other;Children Available Help at Discharge: Family   Home Access: Stairs to enter Entrance Stairs-Rails: Psychiatric nurse of Steps: 4          Prior Function Level of Independence: Independent         Comments: Pt was working as a Engineer, structural, 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  Pt is able to wiggle his toes and do some basic hip mobiltiy but lifting  and knee movements are very pain limited       Communication   Communication: No difficulties  Cognition Arousal/Alertness: Awake/alert   Overall Cognitive Status: Within Functional Limits for tasks assessed                      General Comments      Exercises General Exercises - Lower Extremity Ankle Circles/Pumps: 5 reps;Both;AROM Quad Sets: Strengthening;5 reps;Both Gluteal Sets: Strengthening;5 reps;Both Hip ABduction/ADduction: 5 reps;AROM;AAROM      Assessment/Plan    PT Assessment Patient needs continued PT services  PT Diagnosis Difficulty walking;Generalized weakness;Acute pain   PT Problem List Decreased strength;Decreased activity tolerance;Decreased range of motion;Decreased balance;Decreased mobility;Decreased coordination;Decreased knowledge of use of DME;Decreased safety awareness;Pain  PT Treatment Interventions DME  instruction;Gait training;Functional mobility training;Stair training;Therapeutic activities;Therapeutic exercise;Balance training;Neuromuscular re-education;Patient/family education   PT Goals (Current goals can be found in the Care Plan section) Acute Rehab PT Goals Patient Stated Goal: Pt wanting to go home ASAP PT Goal Formulation: With patient/family Time For Goal Achievement: 06/21/15 Potential to Achieve Goals: Good    Frequency BID   Barriers to discharge        Co-evaluation               End of Session Equipment Utilized During Treatment: Gait belt Activity Tolerance: Patient limited by pain Patient left: with call bell/phone within reach;with chair alarm set;with family/visitor present           Time: 0800-0826 PT Time Calculation (min) (ACUTE ONLY): 26 min   Charges:   PT Evaluation $PT Eval Low Complexity: 1 Procedure PT Treatments $Therapeutic Exercise: 8-22 mins   PT G Codes:       Wayne Both, PT, DPT [phone]  [name] 06/07/2015, 10:34 AM

## 2015-06-07 NOTE — Progress Notes (Signed)
Physical Therapy Treatment Patient Details Name: Tommy [name] MRN: [id number] DOB: November 18, 1965 Today's Date: 06/07/2015    History of Present Illness Pt here with R tib/fib fracture and ORIF.  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     PT Comments    Pt continues to be eager to get out of the hospital and shows good effort with mobility, ambulation and steps.  He is able to negotiate up/down steps with close supervision but is physically able to do all the lifting/hopping he needs to be able to enter/exit his home.  He continues to have expected pain and does need somewhat frequent cuing to use ADs appropriately, but ultimately he should be able to go home with his family w/o issue.  Follow Up Recommendations  Home health PT (per MD)     Equipment Recommendations       Recommendations for Other Services       Precautions / Restrictions Precautions Precautions: Fall Restrictions RLE Weight Bearing: Non weight bearing    Mobility  Bed Mobility Overal bed mobility: Modified Independent                Transfers Overall transfer level: Modified independent Equipment used: Crutches             General transfer comment: Pt continues to need cues to use crutches appropriately during sit/stand transitions.  He is able to maintain R NWBing well, but is at time impulsive/wanting to move too fast and needs cues to slow and be more deliberate  Ambulation/Gait Ambulation/Gait assistance: Min guard Ambulation Distance (Feet): 100 Feet Assistive device: Crutches       General Gait Details: Pt able to use step-to and step-through techniques with crutches and maintains R NWBing well.  He has occasional mis cues with crutch placement but is able to self correct and generally shows good confidence and safety.  Pt fatigued with the effort of prolonged ambulation.    Stairs Stairs: Yes Stairs assistance: Min  assist Stair Management: One rail Right Number of Stairs: 8 General stair comments: Pt intially impulsive and at times unsafe with first set of 4 steps, he did much better and showed more confidence with the second round of 4.  Pt feels confident in ability to get into home  Wheelchair Mobility    Modified Rankin (Stroke Patients Only)       Balance Overall balance assessment: Modified Independent                                  Cognition Arousal/Alertness: Awake/alert Behavior During Therapy: WFL for tasks assessed/performed Overall Cognitive Status: Within Functional Limits for tasks assessed                      Exercises      General Comments        Pertinent Vitals/Pain Pain Score: 5     Home Living                      Prior Function            PT Goals (current goals can now be found in the care plan section) Progress towards PT goals: Progressing toward goals    Frequency  BID    PT Plan Current plan remains appropriate    Co-evaluation  End of Session Equipment Utilized During Treatment: Gait belt Activity Tolerance: Patient tolerated treatment well Patient left: with bed alarm set;with call bell/phone within reach     Time: 1502-1521 PT Time Calculation (min) (ACUTE ONLY): 19 min  Charges:  $Gait Training: 8-22 mins                    G Codes:     Tommy [name], PT, DPT [phone]  Tommy [name] 06/07/2015, 4:38 PM

## 2015-06-07 NOTE — Progress Notes (Signed)
Subjective:  Postoperative day #1 status post intramedullary fixation of right tibia fracture. Patient reports right leg pain as moderate.  Patient was able to perform physical therapy this morning. I reviewed the physical therapy note. Patient is having more pain after therapy but overall his pain is improved compared to yesterday. He has family and friends at the bedside.  Objective:   VITALS:   Filed Vitals:   06/06/15 1946 06/06/15 2049 06/07/15 0515 06/07/15 0801  BP: 107/59 107/64 112/58 124/64  Pulse: 78 91 72 71  Temp: 98.3 F (36.8 C) 98.2 F (36.8 C) 97.7 F (36.5 C) 97.4 F (36.3 C)  TempSrc: Oral Oral Oral Oral  Resp: 20 20 18 18   Height:      Weight:      SpO2: 93% 95% 97% 99%    PHYSICAL EXAM:  Right lower extremity: patient's dressin n splint are clean dry and intact. He has intact sensation to light touch in all 5 digits of the right foot. His toes are well-perfused. He can flex and extend his toes and gently without significant pain. He had no pain to passive stretch of his right toes today. His right leg is elevated on pillows. There is no clinical evidence of compartment syndrome at this time.   LABS  Results for orders placed or performed during the hospital encounter of 06/05/15 (from the past 24 hour(s))  CBC     Status: Abnormal   Collection Time: 06/07/15  4:47 AM  Result Value Ref Range   WBC 15.5 (H) 3.8 - 10.6 K/uL   RBC 4.02 (L) 4.40 - 5.90 MIL/uL   Hemoglobin 12.8 (L) 13.0 - 18.0 g/dL   HCT 36.4 (L) 40.0 - 52.0 %   MCV 90.5 80.0 - 100.0 fL   MCH 31.9 26.0 - 34.0 pg   MCHC 35.3 32.0 - 36.0 g/dL   RDW 12.6 11.5 - 14.5 %   Platelets 228 150 - 440 K/uL  Basic metabolic panel     Status: Abnormal   Collection Time: 06/07/15  4:47 AM  Result Value Ref Range   Sodium 138 135 - 145 mmol/L   Potassium 3.9 3.5 - 5.1 mmol/L   Chloride 103 101 - 111 mmol/L   CO2 27 22 - 32 mmol/L   Glucose, Bld 128 (H) 65 - 99 mg/dL   BUN 9 6 - 20 mg/dL   Creatinine, Ser 0.74 0.61 - 1.24 mg/dL   Calcium 8.7 (L) 8.9 - 10.3 mg/dL   GFR calc non Af Amer >60 >60 mL/min   GFR calc Af Amer >60 >60 mL/min   Anion gap 8 5 - 15    Dg Tibia/fibula Right  06/06/2015  ADDENDUM REPORT: 06/06/2015 12:58 ADDENDUM: The report should read ORIF right tibia with good anatomic alignment. Hardware intact. Proximal fibular fracture again noted. Electronically Signed   By: Marcello Moores  Register   On: 06/06/2015 12:58  06/06/2015  CLINICAL DATA:  ORIF. EXAM: DG C-ARM 61-120 MIN COMPARISON:  06/05/2015. FINDINGS: ORIF left tibia good anatomic alignment. Hardware intact. Proximal fibular fracture noted. IMPRESSION: ORIF left tibia with good anatomic alignment. Proximal fibular fracture noted. 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 FINDINGS:  There are fractures of the right tibia and fibula. The tibia fracture is spiral,, without significant comminution, along the mid to distal shaft. It is mildly displaced, with the distal fracture component displacing posteriorly by 11 mm. Fractures also mildly overlapped/ foreshortened, by 1 cm. The fibular fracture is of the proximal fibular shaft extending to the metaphysis. It is oblique, mildly comminuted and mildly displaced posteriorly and laterally by 5 mm, as well as foreshortened/overlapped, also by 5 mm. No other fractures.  Knee and ankle joints are normally aligned. IMPRESSION: Fractures of the right tibia, along the mid to distal shaft, and right fibula, across the proximal metaphysis and proximal shaft, mildly displaced as described. Electronically Signed   By: Lajean Manes M.D.   On: 06/05/2015 21:32   Dg Ankle 2 Views Right  06/05/2015  CLINICAL  DATA:  [place] into hole, with obvious deformity of the right lower leg. Initial encounter. EXAM: RIGHT ANKLE - 2 VIEW COMPARISON:  None. FINDINGS: There is mildly worsened lateral displacement and slightly worsened shortening at the tibial diaphyseal fracture site, though the degree of posterior displacement is improved. Slight posterior tilt is noted. There appears to be a minimally displaced fracture of the posterior malleolus, not well characterized on the prior study. The fracture of the proximal fibula is not well seen on this study. The soft tissues are difficult to fully assess due to the overlying splint. No definite open wound is seen, though this would be better assessed on clinical exam. IMPRESSION: 1. Mildly worsened lateral displacement and slightly worsened shortening at the tibial diaphyseal fracture site, though the degree of posterior displacement is improved. Slight posterior tilt seen. 2. Minimally displaced fracture of the posterior malleolus, not well characterized on the prior study. 3. Fracture of the proximal fibula is not well seen on this study. Electronically Signed   By: Garald Balding M.D.   On: 06/05/2015 22:30   Dg Tibia/fibula Right Port  06/06/2015  CLINICAL DATA:  Right tibia rod placement, tibial and fibular fractures. EXAM: PORTABLE RIGHT TIBIA AND FIBULA - 2 VIEW COMPARISON:  06/05/2015 and intraoperative radiographs of 06/06/2015 FINDINGS: Fiberglass splint in place. Antegrade intramedullary nail in the tibia with single proximal and single distal interlocking screws. The rod traverses the dominant spiral tibial fracture, which has near-anatomic alignment and positioning. On the frontal projection than include the lower tibia, concern is raised for a subtle linear nondisplaced longitudinal fracture extending in the vicinity of the distal rod down to the level of the interlocking nail. This could represent a longitudinal nondisplaced component of the fracture and may have been  present prior to IM nail placement, although is considered highly subtle both on this postoperative study and on preoperative radiographs. Oblique proximal fibular metadiaphyseal fracture observed. IMPRESSION: 1. Long tibial antegrade intramedullary nail with proximal and distal interlocking screws traversing the spiral fracture. There is a subtle linear lucency in the distal tibial metadiaphysis extending to the level of the interlocking screw which could represent a linear nondisplaced longitudinal fracture of the tibia, versus a prominent nutrient foramen. Assuming this is indeed a nondisplaced fracture, it likely occurred at the time of injury rather than operatively. 2. Proximal fibular oblique metadiaphyseal fracture. Electronically Signed   By: Van Clines M.D.   On: 06/06/2015 13:52   Dg C-arm 61-120 Min  06/06/2015  ADDENDUM REPORT: 06/06/2015 12:48 ADDENDUM: The report should read ORIF right tibia with good anatomic alignment. Hardware intact. Proximal fibular fracture again noted. Electronically Signed   By:  [place]   On: 06/06/2015 12:48  06/06/2015  CLINICAL DATA:  ORIF. EXAM: DG C-ARM 61-120 MIN COMPARISON:  06/05/2015. FINDINGS: ORIF left tibia good anatomic alignment. Hardware intact. Proximal fibular fracture noted. IMPRESSION: ORIF left tibia with good anatomic alignment. Proximal fibular fracture noted. Electronically Signed: By: Marcello Moores  Register On: 06/06/2015 11:43    Assessment/Plan: 1 Day Post-Op   Active Problems:   Fracture of right tibia and fibula  Patient doing well postop. Patient will continue to stay with physical therapy. His Foley catheter has been removed. He has completed 24 hours of postop antibiotics. Continue current pain management.  Continue strict elevation of the right lower extremity. Patient will continue to receive frequent neurovascular checks.  Discharge expected Sunday or Monday based on patient's pain and performance with physical therapy.  He is nonweightbearing on his right lower extremity.   Thornton Park , MD 06/07/2015, 12:43 PM

## 2015-06-08 LAB — BASIC METABOLIC PANEL
Anion gap: 7 (ref 5–15)
BUN: 10 mg/dL (ref 6–20)
CALCIUM: 8.4 mg/dL — AB (ref 8.9–10.3)
CO2: 29 mmol/L (ref 22–32)
CREATININE: 0.87 mg/dL (ref 0.61–1.24)
Chloride: 105 mmol/L (ref 101–111)
GFR calc non Af Amer: >60 mL/min (ref >60)
Glucose, Bld: 120 mg/dL — ABNORMAL HIGH (ref 65–99)
Potassium: 3.2 mmol/L — ABNORMAL LOW (ref 3.5–5.1)
SODIUM: 141 mmol/L (ref 135–145)

## 2015-06-08 LAB — URINE CULTURE: Culture: NO GROWTH

## 2015-06-08 LAB — CBC
HEMATOCRIT: 36.2 % — AB (ref 40.0–52.0)
Hemoglobin: 12.5 g/dL — ABNORMAL LOW (ref 13.0–18.0)
MCH: 31.8 pg (ref 26.0–34.0)
MCHC: 34.4 g/dL (ref 32.0–36.0)
MCV: 92.5 fL (ref 80.0–100.0)
PLATELETS: 217 10*3/uL (ref 150–440)
RBC: 3.92 MIL/uL — ABNORMAL LOW (ref 4.40–5.90)
RDW: 12.6 % (ref 11.5–14.5)
WBC: 11.7 10*3/uL — AB (ref 3.8–10.6)

## 2015-06-08 MED ORDER — MAGNESIUM HYDROXIDE 400 MG/5ML PO SUSP
30.0000 mL | Freq: Four times a day (QID) | ORAL | Status: DC | PRN
Start: 2015-06-08 — End: 2015-06-09
  Administered 2015-06-08 (×2): 30 mL via ORAL
  Filled 2015-06-08 (×2): qty 30

## 2015-06-08 NOTE — Progress Notes (Signed)
Physical Therapy Treatment Patient Details Name: Tommy [name] MRN: [id number] DOB: [birth year] Today's Date: 06/08/2015    History of Present Illness Pt here with R tib/fib fracture and ORIF.  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     PT Comments    Pt continues to have significant pain with movement/activity.  He shows good effort with all acts, but does need some extra time with exercises secondary to to some hesitancy.  Pt has pain with light knee extension overpressure and does lack a few degrees - discussed being consistent with quad sets/TKE acts t/o the day.  Follow Up Recommendations  Home health PT (per MD)     Equipment Recommendations  Crutches (discussed w/c and BSC, probably no necessary)    Recommendations for Other Services       Precautions / Restrictions Precautions Precautions: Fall Restrictions Weight Bearing Restrictions: Yes RLE Weight Bearing: Non weight bearing    Mobility  Bed Mobility Overal bed mobility: Modified Independent             General bed mobility comments: Pt gets leg in/out of bed with either heavy UE use or using opposite LE to hook and lift  Transfers Overall transfer level: Modified independent Equipment used: Crutches             General transfer comment: Pt again needing some minimal cuing to use the crutches/UEs appropriately getting to/from standing but ultimately he is safe and able to perform w/o direct assist  Ambulation/Gait Ambulation/Gait assistance: Supervision Ambulation Distance (Feet): 200 Feet Assistive device: Crutches       General Gait Details: Pt starts out slowly and cautiously but gradually increased speed, confidence and cadence.  He does fatigue with the effort, but his O2 remains in the high 90s after the effort.    Stairs            Wheelchair Mobility    Modified Rankin (Stroke Patients Only)       Balance                                     Cognition Arousal/Alertness: Awake/alert Behavior During Therapy: WFL for tasks assessed/performed Overall Cognitive Status: Within Functional Limits for tasks assessed                      Exercises General Exercises - Lower Extremity Ankle Circles/Pumps: Strengthening;10 reps;Right (resisted ankle DF ) Quad Sets: Strengthening;10 reps;Right Gluteal Sets: Strengthening;Right;10 reps Short Arc Quad:  (PROM knee extension gentle pulsed overpressure ) Hip ABduction/ADduction: Strengthening;10 reps;Right Straight Leg Raises: AAROM;10 reps;Right    General Comments        Pertinent Vitals/Pain Pain Assessment: 0-10 Pain Score: 5  Pain Location: R LE near ankle Pain Descriptors / Indicators: Aching;Constant Pain Intervention(s): Limited activity within patient's tolerance;Monitored during session;Premedicated before session    Home Living Family/patient expects to be discharged to:: Private residence Living Arrangements: Spouse/significant other Available Help at Discharge: Family Type of Home: House Home Access: Stairs to enter Entrance Stairs-Rails: Can reach [name] Home Layout: One level Home Equipment: None      Prior Function Level of Independence: Independent      Comments: Pt was working as a Engineer, structural, 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   PT Goals (current goals can now be found in the  care plan section) Progress towards PT goals: Progressing toward goals    Frequency  BID    PT Plan Current plan remains appropriate    Co-evaluation             End of Session Equipment Utilized During Treatment: Gait belt Activity Tolerance: Patient tolerated treatment well Patient left: with bed alarm set;with call bell/phone within reach     Time: 0835-0903 PT Time Calculation (min) (ACUTE ONLY): 28 min  Charges:  $Gait Training: 8-22 mins $Therapeutic Exercise: 8-22 mins                    G Codes:     Tommy [name], PT, DPT [phone]  Tommy [name] 06/08/2015, 10:50 AM

## 2015-06-08 NOTE — Evaluation (Signed)
Occupational Therapy Evaluation Patient Details Name: Tommy [name] MRN: [id number] DOB: 01/06/1966 Today's Date: 06/08/2015    History of Present Illness Pt here with R tib/fib fracture and ORIF.  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    Clinical Impression   Pt is 50year old male s/p R ORIF of tib/fib.  Pt was independent in all ADLs prior to surgery working full time as a Engineer, structural and is eager to return to PLOF.  Pt currently requires min assist for LB dressing while in seated position due to pain and limited AROM of R LE.  Reviewed dressing techniques and pt was able to complete LB dressing with supervision afterwards.  Rec purchasing a transfer tub bench when cleared to shower to increase safety with bathing.  Pt given adaptive equipment catalog and reviewed rec equipment.  No further OT recommended. Pt seen for evaluation only.       Follow Up Recommendations  No OT follow up    Equipment Recommendations  Tub/shower bench    Recommendations for Other Services       Precautions / Restrictions Precautions Precautions: Fall Restrictions Weight Bearing Restrictions: Yes RLE Weight Bearing: Non weight bearing      Mobility Bed Mobility                  Transfers                      Balance                                            ADL Overall ADL's : Needs assistance/impaired                                       General ADL Comments: Pt requires min assist to complete LB dressing skills sitting EOB but with cues for different tech he was able to complete with supervision.  Independent after set up for grooming, self feeding, UB dressing and bathing skills.  Rec a transfer tub bench when cleared to shower to prevent falls in and out of tub and reviewed in handout which was left with him.       Vision     Perception     Praxis      Pertinent  Vitals/Pain Pain Assessment: 0-10 Pain Score: 4  Pain Location: R LE near ankle Pain Descriptors / Indicators: Aching;Constant Pain Intervention(s): Limited activity within patient's tolerance;Monitored during session;Premedicated before session     Hand Dominance Right   Extremity/Trunk Assessment Upper Extremity Assessment Upper Extremity Assessment: Overall WFL for tasks assessed   Lower Extremity Assessment Lower Extremity Assessment: Defer to PT evaluation       Communication Communication Communication: No difficulties   Cognition Arousal/Alertness: Awake/alert Behavior During Therapy: WFL for tasks assessed/performed Overall Cognitive Status: Within Functional Limits for tasks assessed                     General Comments       Exercises       Shoulder Instructions      Home Living Family/patient expects to be discharged to:: Private residence Living Arrangements: Spouse/significant other Available Help at  Discharge: Family Type of Home: House Home Access: Stairs to enter Technical brewer of Steps: 4 Entrance Stairs-Rails: Can reach both Home Layout: One level     Bathroom Shower/Tub: Tub/shower unit Shower/tub characteristics: Architectural technologist: Standard Bathroom Accessibility: Yes   Home Equipment: None          Prior Functioning/Environment Level of Independence: Independent        Comments: Pt was working as a Engineer, structural, 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    OT Diagnosis:     OT Problem List:     OT Treatment/Interventions:      OT Goals(Current goals can be found in the care plan section)    OT Frequency:     Barriers to D/C:            Co-evaluation              End of Session    Activity Tolerance: Patient tolerated treatment well Patient left: in bed;with family/visitor present   Time: 0920-0940 OT Time Calculation (min): 20 min Charges:  OT General Charges $OT Visit: 1 Procedure OT  Evaluation $OT Eval Low Complexity: 1 Procedure OT Treatments $Self Care/Home Management : 8-22 mins G-Codes:    Kimila Papaleo 2015-06-29, 10:28 AM   Chrys Racer, OTR/L ascom [phone]

## 2015-06-08 NOTE — Progress Notes (Signed)
Subjective:  Postoperative day #2 status post intramedullary fixation for right tibia/fibular fracture. Patient reports pain as moderate.  Patient is lying in bed with his right leg elevated on pillows. His wife is at the bedside. Patient has increasing pain with physical therapy. Patient describes his pain as throbbing.  Objective:   VITALS:   Filed Vitals:   06/07/15 1548 06/07/15 2113 06/08/15 0547 06/08/15 0733  BP: 118/59 126/67 95/52 112/51  Pulse: 79 78 72 71  Temp: 98.2 F (36.8 C) 98.1 F (36.7 C) 98 F (36.7 C) 97.9 F (36.6 C)  TempSrc: Oral Oral Oral Oral  Resp: 18 20  18   Height:      Weight:      SpO2: 95% 98% 95% 94%    PHYSICAL EXAM:  Right lower extremity: Patient's dressing and splint are clean dry and intact. He can flex and extend his toes. His toes are well-perfused. He has a palpable dorsalis pedis pulse. Patient has no pain with passive stretch of his toes. There is no significant foot swelling.   LABS  Results for orders placed or performed during the hospital encounter of 06/05/15 (from the past 24 hour(s))  CBC     Status: Abnormal   Collection Time: 06/08/15  4:41 AM  Result Value Ref Range   WBC 11.7 (H) 3.8 - 10.6 K/uL   RBC 3.92 (L) 4.40 - 5.90 MIL/uL   Hemoglobin 12.5 (L) 13.0 - 18.0 g/dL   HCT 36.2 (L) 40.0 - 52.0 %   MCV 92.5 80.0 - 100.0 fL   MCH 31.8 26.0 - 34.0 pg   MCHC 34.4 32.0 - 36.0 g/dL   RDW 12.6 11.5 - 14.5 %   Platelets 217 150 - 440 K/uL  Basic metabolic panel     Status: Abnormal   Collection Time: 06/08/15  4:41 AM  Result Value Ref Range   Sodium 141 135 - 145 mmol/L   Potassium 3.2 (L) 3.5 - 5.1 mmol/L   Chloride 105 101 - 111 mmol/L   CO2 29 22 - 32 mmol/L   Glucose, Bld 120 (H) 65 - 99 mg/dL   BUN 10 6 - 20 mg/dL   Creatinine, Ser 0.87 0.61 - 1.24 mg/dL   Calcium 8.4 (L) 8.9 - 10.3 mg/dL   GFR calc non Af Amer >60 >60 mL/min   GFR calc Af Amer >60 >60 mL/min   Anion gap 7 5 - 15    Dg Tibia/fibula Right  Port  06/06/2015  CLINICAL DATA:  Right tibia rod placement, tibial and fibular fractures. EXAM: PORTABLE RIGHT TIBIA AND FIBULA - 2 VIEW COMPARISON:  06/05/2015 and intraoperative radiographs of 06/06/2015 FINDINGS: Fiberglass splint in place. Antegrade intramedullary nail in the tibia with single proximal and single distal interlocking screws. The rod traverses the dominant spiral tibial fracture, which has near-anatomic alignment and positioning. On the frontal projection than include the lower tibia, concern is raised for a subtle linear nondisplaced longitudinal fracture extending in the vicinity of the distal rod down to the level of the interlocking nail. This could represent a longitudinal nondisplaced component of the fracture and may have been present prior to IM nail placement, although is considered highly subtle both on this postoperative study and on preoperative radiographs. Oblique proximal fibular metadiaphyseal fracture observed. IMPRESSION: 1. Long tibial antegrade intramedullary nail with proximal and distal interlocking screws traversing the spiral fracture. There is a subtle linear lucency in the distal tibial metadiaphysis extending to the level of the interlocking  screw which could represent a linear nondisplaced longitudinal fracture of the tibia, versus a prominent nutrient foramen. Assuming this is indeed a nondisplaced fracture, it likely occurred at the time of injury rather than operatively. 2. Proximal fibular oblique metadiaphyseal fracture. Electronically Signed   By: Van Clines M.D.   On: 06/06/2015 13:52    Assessment/Plan: 2 Days Post-Op   Active Problems:   Fracture of right tibia and fibula  Patient is improving postop. He continues to struggle with pain. He is receiving muscle relaxers and narcotics for pain. There are no clinical signs of compartment syndrome. He is making slow, steady progress with physical therapy. Continue Lovenox for DVT prophylaxis.  Encourage incentive spirometry while awake.   Thornton Park , MD 06/08/2015, 1:10 PM

## 2015-06-08 NOTE — Care Management Note (Signed)
Case Management Note  Patient Details  Name: Tommy [name] MRN: [id number] Date of Birth: [birth year]  Subjective/Objective:    50yo Mr Tommy [name] was admitted 06/05/15. On 06/06/15 he received a surgical repair of a right tib-fib fracture by Dr Mack Guise. PCP=[facility]. Pharmacy=[facility] (closed on Sundays). Resides with his wife. Is currently using crutches to move from his wheelchair to the bed and reports that PT recommended crutches rather than a rolling walker. No home health services or home oxygen. No home assistive equipment. Anticipate home with home health PT. Case management will follow for discharge planning.                 Action/Plan:   Expected Discharge Date:  06/08/15               Expected Discharge Plan:     In-House Referral:     Discharge planning Services     Post Acute Care Choice:    Choice offered to:     DME Arranged:    DME Agency:     HH Arranged:    [place] Agency:     Status of Service:     Medicare Important Message Given:    Date Medicare IM Given:    Medicare IM give by:    Date Additional Medicare IM Given:    Additional Medicare Important Message give by:     If discussed at [place] of Stay Meetings, dates discussed:    Additional Comments:  Mahima Hottle A, RN 06/08/2015, 3:23 PM

## 2015-06-09 LAB — CBC
HEMATOCRIT: 35.7 % — AB (ref 40.0–52.0)
Hemoglobin: 12.6 g/dL — ABNORMAL LOW (ref 13.0–18.0)
MCH: 31.9 pg (ref 26.0–34.0)
MCHC: 35.3 g/dL (ref 32.0–36.0)
MCV: 90.2 fL (ref 80.0–100.0)
PLATELETS: 245 10*3/uL (ref 150–440)
RBC: 3.96 MIL/uL — AB (ref 4.40–5.90)
RDW: 12.7 % (ref 11.5–14.5)
WBC: 10.7 10*3/uL — ABNORMAL HIGH (ref 3.8–10.6)

## 2015-06-09 MED ORDER — DIAZEPAM 5 MG PO TABS
5.0000 mg | ORAL_TABLET | ORAL | Status: DC | PRN
  Administered 2015-06-09: 5 mg via ORAL
  Filled 2015-06-09: qty 1

## 2015-06-09 MED ORDER — DOCUSATE SODIUM 100 MG PO CAPS: 100.0000 mg | ORAL_CAPSULE | Freq: Two times a day (BID) | ORAL | Status: DC

## 2015-06-09 MED ORDER — DIAZEPAM 5 MG PO TABS: 5.0000 mg | ORAL_TABLET | ORAL | Status: DC | PRN

## 2015-06-09 MED ORDER — LACTULOSE 10 GM/15ML PO SOLN
20.0000 g | Freq: Two times a day (BID) | ORAL | Status: DC | PRN
  Administered 2015-06-09: 20 g via ORAL
  Filled 2015-06-09: qty 30

## 2015-06-09 MED ORDER — OXYCODONE HCL 10 MG PO TABS: 10.0000 mg | ORAL_TABLET | ORAL | Status: DC | PRN

## 2015-06-09 MED ORDER — ENOXAPARIN SODIUM 40 MG/0.4ML ~~LOC~~ SOLN: 40.0000 mg | SUBCUTANEOUS | Status: DC

## 2015-06-09 NOTE — Progress Notes (Signed)
MD paged

## 2015-06-09 NOTE — Progress Notes (Signed)
Physical Therapy Treatment Patient Details Name: Tommy [name] MRN: [id number] DOB: [birth year] Today's Date: 06/09/2015    History of Present Illness Pt here with R tib/fib fracture and ORIF.  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     PT Comments    Pt was standing with crutches with daughter present when entered room.  Pt initially utilized a step to gait pattern with crutches with yellow hospital sock on uninvolved foot.  However, after applying shoe to uninvolved LE, pt displayed a step through gait pattern with crutches.  Pt performed 2 sets of 4 stairs with right rail with a rest break in between.  Pt required occasional verbal cues for involved foot clearance with stairs.  Pt ambulated a total of 200 feet with B axillary crutches and pt stated that he was tired at the end of the session.  Pt did not require verbal cues for transfers.  Pt able to maintain [place] status throughout session and no loss of balance noted during session.  With activity pt c/o 8/10 R LE pain but once pt was laying in bed end of session with R LE elevated, pt reporting no R LE pain.  Pt appears safe to discharge home with support/assist of family when medically appropriate.    Follow Up Recommendations  Home health PT (per MD)     Equipment Recommendations  Crutches    Recommendations for Other Services       Precautions / Restrictions Precautions Precautions: Fall Restrictions Weight Bearing Restrictions: Yes RLE Weight Bearing: Non weight bearing    Mobility  Bed Mobility               General bed mobility comments: Pt was standing up when entered the room   Transfers Overall transfer level: Modified independent Equipment used: Crutches             General transfer comment: Pt did not need cueing for overall transfers.    Ambulation/Gait Ambulation/Gait assistance: Supervision Ambulation Distance (Feet): 200  Feet Assistive device: Crutches Gait Pattern/deviations: Step-to pattern;Step-through pattern     General Gait Details: Initially pt was step to then when shoe was applied, pt changed to step through gait pattern   Stairs Stairs: Yes Stairs assistance: Min assist Stair Management: One rail Right Number of Stairs: 8 (2 sets of 4 with rest break in between) General stair comments: Pt was overall confiedent during stairs.  Pt required verbal cues for involved LE placement during stairs.  Wheelchair Mobility    Modified Rankin (Stroke Patients Only)       Balance Overall balance assessment: Modified Independent                                  Cognition Arousal/Alertness: Awake/alert Behavior During Therapy: WFL for tasks assessed/performed Overall Cognitive Status: Within Functional Limits for tasks assessed                      Exercises      General Comments   Nursing was contacted and cleared pt for physical therapy.  Pt was agreeable and tolerated session well. Pt's daughter was present during session.      Pertinent Vitals/Pain Pain Assessment: 0-10 Pain Score: 8  Pain Location: R Le near ankle  Pain Descriptors / Indicators: Aching;Constant Pain Intervention(s): Limited activity within patient's  tolerance;Monitored during session;Premedicated before session;Repositioned    Home Living                      Prior Function            PT Goals (current goals can now be found in the care plan section) Acute Rehab PT Goals Patient Stated Goal: to go home PT Goal Formulation: With patient/family Time For Goal Achievement: 06/21/15 Potential to Achieve Goals: Good Progress towards PT goals: Progressing toward goals    Frequency  BID    PT Plan Current plan remains appropriate    Co-evaluation             End of Session Equipment Utilized During Treatment: Gait belt Activity Tolerance: Patient tolerated treatment  well Patient left: in bed;with family/visitor present; call light in reach     Time: 1011-1038 PT Time Calculation (min) (ACUTE ONLY): 27 min  Charges:                       G Codes:      Mittie Bodo, SPT Mittie Bodo 06/09/2015, 10:52 AM

## 2015-06-09 NOTE — Progress Notes (Signed)
  Subjective:  Postoperative day #3 status post intramedullary fixation for right tibia fracture. Patient reports pain as mild.  His pain is improved today. Patient has no other complaints. His daughter is at the bedside.  Objective:   VITALS:   Filed Vitals:   06/09/15 0500 06/09/15 0725 06/09/15 0726 06/09/15 1024  BP: 101/48 92/50 101/59   Pulse: 85 81 72 115  Temp: 98 F (36.7 C) 98 F (36.7 C)    TempSrc: Oral Oral    Resp: 18 18    Height:      Weight:      SpO2: 93% 95%  97%    PHYSICAL EXAM:  Right lower extremity: Patient's dressing and splint are clean and dry. He can flex and extend his toes actively today without pain. He has no pain with passive stretch. He has a palpable dorsalis pedis pulse. His toes are well-perfused. He has intact sensation light touch in all 5 toes and the dorsum of his foot.   LABS  Results for orders placed or performed during the hospital encounter of 06/05/15 (from the past 24 hour(s))  CBC     Status: Abnormal   Collection Time: 06/09/15  4:50 AM  Result Value Ref Range   WBC 10.7 (H) 3.8 - 10.6 K/uL   RBC 3.96 (L) 4.40 - 5.90 MIL/uL   Hemoglobin 12.6 (L) 13.0 - 18.0 g/dL   HCT 35.7 (L) 40.0 - 52.0 %   MCV 90.2 80.0 - 100.0 fL   MCH 31.9 26.0 - 34.0 pg   MCHC 35.3 32.0 - 36.0 g/dL   RDW 12.7 11.5 - 14.5 %   Platelets 245 150 - 440 K/uL    No results found.  Assessment/Plan: 3 Days Post-Op   Active Problems:   Fracture of right tibia and fibula  Patient is doing well postop. He has made good progress during this hospitalization. He'll be discharged home today. He'll receive home health PT. Patient be nonweightbearing on the right lower extremity. He is encouraged to continue strict elevation and ice at home. He'll follow up in the office in 7-10 days for wound check, staple removal and x-ray. She'll continue on Lovenox 40 mg daily for DVT prophylaxis 4 weeks.   Thornton Park , MD 06/09/2015, 1:30 PM

## 2015-06-09 NOTE — Discharge Summary (Signed)
Physician Discharge Summary  Patient ID: Tommy [name] MRN: [id number] DOB/AGE: 12-13-1965 50 y.o.  Admit date: 06/05/2015 Discharge date: 06/09/2015  Admission Diagnoses:  Fracture right tibia   Discharge Diagnoses:  Fracture right tibia Active Problems:   Fracture of right tibia and fibula   History reviewed. No pertinent past medical history.  Surgeries: Procedure(s): INTRAMEDULLARY (IM) NAIL TIBIAL on 06/05/2015 - 06/06/2015   Consultants (if any):    Discharged Condition: Improved  Hospital Course: Tommy [name] is an 50 y.o. male who was admitted 06/05/2015 with a diagnosis of  Fracture right tibia and fibula and went to the operating room on  [id number] for an uncomplicated intramedullary fixation of his right tibia.Marland Kitchen    He was given perioperative antibiotics:  Anti-infectives    Start     Dose/Rate Route Frequency Ordered Stop   06/06/15 1600  ceFAZolin (ANCEF) IVPB 2 g/50 mL premix     2 g 100 mL/hr over 30 Minutes Intravenous Every 6 hours 06/06/15 1408 06/07/15 0359   06/05/15 2155  [MAR Hold]  ceFAZolin (ANCEF) IVPB 2 g/50 mL premix     (MAR Hold since 06/06/15 0846)   2 g 100 mL/hr over 30 Minutes Intravenous 30 min pre-op 06/05/15 2156 06/06/15 0945    .  He was given sequential compression devices, early ambulation, and lovenox for DVT prophylaxis.  Patient dealt with significant pain perioperatively. His pain required Dilaudid and oxycodone.  Patient's Foley catheter was removed on postop day 1. His physical therapy evaluation also beginning on postop day #1. Patient underwent strict elevation of the right lower extremity with ice application to the right leg whenever he was in bed. Patient was monitored closely for compartment syndrome. He did not demonstrate signs or compartment syndrome during this hospitalization. Patient made good progress during his hospitalization. On postop day 3 his pain as mild. His clinical improvement he is prepared for discharge home  with home health PT.  He benefited maximally from the hospital stay and there were no complications.    Recent vital signs:  Filed Vitals:   06/09/15 0726 06/09/15 1024  BP: 101/59   Pulse: 72 115  Temp:    Resp:      Recent laboratory studies:  Lab Results  Component Value Date   HGB 12.6* 06/09/2015   HGB 12.5* 06/08/2015   HGB 12.8* 06/07/2015   Lab Results  Component Value Date   WBC 10.7* 06/09/2015   PLT 245 06/09/2015   Lab Results  Component Value Date   INR 0.98 06/05/2015   Lab Results  Component Value Date   NA 141 06/08/2015   K 3.2* 06/08/2015   CL 105 06/08/2015   CO2 29 06/08/2015   BUN 10 06/08/2015   CREATININE 0.87 06/08/2015   GLUCOSE 120* 06/08/2015    Discharge Medications:     Medication List    TAKE these medications        diazepam 5 MG tablet  Commonly known as:  VALIUM  Take 1 tablet (5 mg total) by mouth every 4 (four) hours as needed for anxiety or muscle spasms.     docusate sodium 100 MG capsule  Commonly known as:  COLACE  Take 1 capsule (100 mg total) by mouth 2 (two) times daily.     enoxaparin 40 MG/0.4ML injection  Commonly known as:  LOVENOX  Inject 0.4 mLs (40 mg total) into the skin daily.     Oxycodone HCl 10 MG Tabs  Take 1-1.5  tablets (10-15 mg total) by mouth every 3 (three) hours as needed for breakthrough pain ((for MODERATE breakthrough pain)).        Diagnostic Studies: Dg Tibia/fibula Right  06/06/2015  ADDENDUM REPORT: 06/06/2015 12:58 ADDENDUM: The report should read ORIF right tibia with good anatomic alignment. Hardware intact. Proximal fibular fracture again noted. Electronically Signed   By: Marcello Moores  Register   On: 06/06/2015 12:58  06/06/2015  CLINICAL DATA:  ORIF. EXAM: DG C-ARM 61-120 MIN COMPARISON:  06/05/2015. FINDINGS: ORIF left tibia good anatomic alignment. Hardware intact. Proximal fibular fracture noted. IMPRESSION: ORIF left tibia with good anatomic alignment. Proximal fibular fracture  noted. 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 FINDINGS: There are fractures of the right tibia and fibula. The tibia fracture is spiral,, without significant comminution, along the mid to distal shaft. It is mildly displaced, with the distal fracture component displacing posteriorly by 11 mm. Fractures also mildly overlapped/ foreshortened, by 1 cm. The fibular fracture is of the proximal fibular shaft extending to the metaphysis. It is oblique, mildly comminuted and mildly displaced posteriorly and laterally by 5 mm, as well as foreshortened/overlapped, also by 5 mm. No other fractures.  Knee and ankle joints are normally aligned. IMPRESSION: Fractures of the right tibia, along the mid to distal shaft, and right fibula, across the proximal metaphysis and proximal shaft, mildly displaced as described. Electronically Signed   By: Lajean Manes M.D.   On: 06/05/2015 21:32   Dg Ankle 2 Views Right  06/05/2015  CLINICAL DATA:  [place] into hole, with obvious deformity of the right lower leg. Initial encounter. EXAM: RIGHT ANKLE - 2 VIEW COMPARISON:  None. FINDINGS: There is mildly worsened lateral displacement and slightly worsened shortening at the tibial diaphyseal fracture site, though the degree of posterior displacement is improved. Slight posterior tilt is noted. There appears to be a minimally displaced fracture of the posterior malleolus, not well characterized on the prior study. The fracture of the proximal fibula is not well seen on this study. The soft tissues are difficult to fully assess due to the overlying splint. No definite open wound is seen, though this would be better assessed on clinical exam. IMPRESSION: 1.  Mildly worsened lateral displacement and slightly worsened shortening at the tibial diaphyseal fracture site, though the degree of posterior displacement is improved. Slight posterior tilt seen. 2. Minimally displaced fracture of the posterior malleolus, not well characterized on the prior study. 3. Fracture of the proximal fibula is not well seen on this study. Electronically Signed   By: Garald Balding M.D.   On: 06/05/2015 22:30   Dg Tibia/fibula Right Port  06/06/2015  CLINICAL DATA:  Right tibia rod placement, tibial and fibular fractures. EXAM: PORTABLE RIGHT TIBIA AND FIBULA - 2 VIEW COMPARISON:  06/05/2015 and intraoperative radiographs of 06/06/2015 FINDINGS: Fiberglass splint in place. Antegrade intramedullary nail in the tibia with single proximal and single distal interlocking screws. The rod traverses the dominant spiral tibial fracture, which has near-anatomic alignment and positioning. On the frontal projection than include the lower tibia, concern is raised for a subtle linear nondisplaced longitudinal fracture extending in the vicinity of the distal rod down to the level  of the interlocking nail. This could represent a longitudinal nondisplaced component of the fracture and may have been present prior to IM nail placement, although is considered highly subtle both on this postoperative study and on preoperative radiographs. Oblique proximal fibular metadiaphyseal fracture observed. IMPRESSION: 1. Long tibial antegrade intramedullary nail with proximal and distal interlocking screws traversing the spiral fracture. There is a subtle linear lucency in the distal tibial metadiaphysis extending to the level of the interlocking screw which could represent a linear nondisplaced longitudinal fracture of the tibia, versus a prominent nutrient foramen. Assuming this is indeed a nondisplaced fracture, it likely occurred at the time of injury rather than operatively. 2. Proximal fibular oblique  metadiaphyseal fracture. Electronically Signed   By: Van Clines M.D.   On: 06/06/2015 13:52   Dg C-arm 61-120 Min  06/06/2015  ADDENDUM REPORT: 06/06/2015 12:48 ADDENDUM: The report should read ORIF right tibia with good anatomic alignment. Hardware intact. Proximal fibular fracture again noted. Electronically Signed   By: Marcello Moores  Register   On: 06/06/2015 12:48  06/06/2015  CLINICAL DATA:  ORIF. EXAM: DG C-ARM 61-120 MIN COMPARISON:  06/05/2015. FINDINGS: ORIF left tibia good anatomic alignment. Hardware intact. Proximal fibular fracture noted. IMPRESSION: ORIF left tibia with good anatomic alignment. Proximal fibular fracture noted. Electronically Signed: By: Marcello Moores  Register On: 06/06/2015 11:43    Disposition: Final discharge disposition not confirmed      Discharge Instructions    Call MD / Call 911    Complete by:  As directed   If you experience chest pain or shortness of breath, CALL 911 and be transported to the hospital emergency room.  If you develope a fever above 101 F, pus (white drainage) or increased drainage or redness at the wound, or calf pain, call your surgeon's office.     Constipation Prevention    Complete by:  As directed   Drink plenty of fluids.  Prune juice may be helpful.  You may use a stool softener, such as Colace (over the counter) 100 mg twice a day.  Use MiraLax (over the counter) for constipation as needed.     Diet general    Complete by:  As directed      Discharge instructions    Complete by:  As directed   Patient will remain nonweightbearing on the right lower extremity. He will use crutches or a walker for assistance with ambulation. He is to continue strict elevation of the right lower extremity on pillows at home. He may apply ice to the right lower leg. Patient will continue taking Lovenox 40 mg daily for DVT prophylaxis. He'll receive home health physical therapy for lower extremity strengthening and home safety evaluation.     Driving  restrictions    Complete by:  As directed   No driving for 6 weeks     Increase activity slowly as tolerated    Complete by:  As directed      Lifting restrictions    Complete by:  As directed   No lifting for 12 weeks              Signed: Thornton Park ,MD 06/09/2015, 1:41 PM

## 2015-06-09 NOTE — Progress Notes (Signed)
Spoke with Dr. Mack Guise, okay to leave pt IV out. Valium order changed to PO.

## 2015-06-09 NOTE — Care Management Note (Addendum)
Case Management Note  Patient Details  Name: YIAN SCHWEIKERT MRN: [id number] Date of Birth: 1965/09/03  Subjective/Objective:    Floydene Flock from [place] was updated about referral for Home Health RN and PT, crutches and a transfer bench. Crutches and a transfer bench were delivered to Mr [facility] [employer] [facility] room today. Workers Comp adjuster Amy Diego Cory was updated about discharge home today. Lovenox 40mg  SQ x 4 weeks called to [facility].                 Action/Plan:   Expected Discharge Date:  06/08/15               Expected Discharge Plan:     In-House Referral:     Discharge planning Services     Post Acute Care Choice:    Choice offered to:     DME Arranged:    DME Agency:     HH Arranged:    [place] Agency:     Status of Service:     Medicare Important Message Given:    Date Medicare IM Given:    Medicare IM give by:    Date Additional Medicare IM Given:    Additional Medicare Important Message give by:     If discussed at [place] of Stay Meetings, dates discussed:    Additional Comments:  Troi Bechtold A, RN 06/09/2015, 1:59 PM

## 2015-06-09 NOTE — Care Management Note (Signed)
Case Management Note  Patient Details  Name: Tommy [name] MRN: [id number] Date of Birth: [birth year]  Subjective/Objective:     Discussed discharge planning with Tommy [name] and his wife. Discussed that most orthopedic patients were discharged home with home health PT. Advised Tommy [name] that his Workmans Comp Adjuster Amy Diego Cory, ph: [phone], Fax: [phone], stated to this writer that she wants all home health services and equipment needs to be supplied by [facility]. Tommy [name] verbalized understanding. Tommy [name] was advised that [facility] would send all MD orders for home health services and DME equipment to [facility] after they were written. Floydene Flock was updated. Anticipate D/C home tomorrow. Continues to have pain control issues.               Action/Plan:   Expected Discharge Date:  06/08/15               Expected Discharge Plan:     In-House Referral:     Discharge planning Services     Post Acute Care Choice:    Choice offered to:     DME Arranged:    DME Agency:     HH Arranged:    [place] Agency:     Status of Service:     Medicare Important Message Given:    Date Medicare IM Given:    Medicare IM give by:    Date Additional Medicare IM Given:    Additional Medicare Important Message give by:     If discussed at [place] of Stay Meetings, dates discussed:    Additional Comments:  Kysha Muralles A, RN 06/09/2015, 10:58 AM

## 2016-02-02 DIAGNOSIS — F17200 Nicotine dependence, unspecified, uncomplicated: Secondary | ICD-10-CM | POA: Insufficient documentation

## 2016-05-10 ENCOUNTER — Inpatient Hospital Stay: Payer: No Typology Code available for payment source

## 2016-05-10 ENCOUNTER — Inpatient Hospital Stay: Payer: No Typology Code available for payment source | Attending: Oncology | Admitting: Oncology

## 2016-05-10 ENCOUNTER — Encounter: Payer: Self-pay | Admitting: Oncology

## 2016-05-10 VITALS — BP 123/75 | HR 67 | Temp 97.3°F | Resp 18 | Ht 69.0 in | Wt 196.1 lb

## 2016-05-10 DIAGNOSIS — G4700 Insomnia, unspecified: Secondary | ICD-10-CM | POA: Diagnosis not present

## 2016-05-10 DIAGNOSIS — Z800 Family history of malignant neoplasm of digestive organs: Secondary | ICD-10-CM | POA: Insufficient documentation

## 2016-05-10 DIAGNOSIS — M25561 Pain in right knee: Secondary | ICD-10-CM | POA: Insufficient documentation

## 2016-05-10 DIAGNOSIS — F17210 Nicotine dependence, cigarettes, uncomplicated: Secondary | ICD-10-CM | POA: Diagnosis not present

## 2016-05-10 DIAGNOSIS — M79604 Pain in right leg: Secondary | ICD-10-CM | POA: Diagnosis not present

## 2016-05-10 DIAGNOSIS — D751 Secondary polycythemia: Secondary | ICD-10-CM | POA: Diagnosis not present

## 2016-05-10 DIAGNOSIS — Z803 Family history of malignant neoplasm of breast: Secondary | ICD-10-CM | POA: Insufficient documentation

## 2016-05-10 DIAGNOSIS — Z8781 Personal history of (healed) traumatic fracture: Secondary | ICD-10-CM | POA: Diagnosis not present

## 2016-05-10 DIAGNOSIS — D72829 Elevated white blood cell count, unspecified: Secondary | ICD-10-CM

## 2016-05-10 DIAGNOSIS — Z801 Family history of malignant neoplasm of trachea, bronchus and lung: Secondary | ICD-10-CM | POA: Insufficient documentation

## 2016-05-10 DIAGNOSIS — Z808 Family history of malignant neoplasm of other organs or systems: Secondary | ICD-10-CM | POA: Insufficient documentation

## 2016-05-10 LAB — CBC
HEMATOCRIT: 47.3 % (ref 40.0–52.0)
HEMOGLOBIN: 16.4 g/dL (ref 13.0–18.0)
MCH: 31.9 pg (ref 26.0–34.0)
MCHC: 34.6 g/dL (ref 32.0–36.0)
MCV: 92.2 fL (ref 80.0–100.0)
Platelets: 287 10*3/uL (ref 150–440)
RBC: 5.13 MIL/uL (ref 4.40–5.90)
RDW: 12.9 % (ref 11.5–14.5)
WBC: 11.5 10*3/uL — AB (ref 3.8–10.6)

## 2016-05-10 LAB — DIFFERENTIAL
BASOS ABS: 0.1 10*3/uL (ref 0–0.1)
BASOS PCT: 1 %
EOS ABS: 0.3 10*3/uL (ref 0–0.7)
Eosinophils Relative: 2 %
Lymphocytes Relative: 30 %
Lymphs Abs: 3.4 10*3/uL (ref 1.0–3.6)
MONOS PCT: 7 %
Monocytes Absolute: 0.8 10*3/uL (ref 0.2–1.0)
Neutro Abs: 6.9 10*3/uL — ABNORMAL HIGH (ref 1.4–6.5)
Neutrophils Relative %: 60 %

## 2016-05-10 LAB — SEDIMENTATION RATE: Sed Rate: 1 mm/hr (ref 0–15)

## 2016-05-10 NOTE — Progress Notes (Signed)
Hematology/Oncology Consult note [facility] Telephone:([phone] [phone]  Patient Care Team: Katheren Shams as PCP - General   Name of the patient: Tommy [name]  [id number]  [birth year]    Reason for referral- leukocytosis   Referring physician- Dr. Netty Starring  Date of visit: 05/10/16   History of presenting illness- patient is a 51 year old male who is a current every day smoker and has smoked 1 pack per day for the last 30 years. Labs from 05/03/2016 showed white count of 14.4, H&H of 16.4/46 with a platelet count of 316. Differential on the CBC showed predominant neutrophilia and lymphocytosis. Prior to that from 03/08/2016 showed white count of 12 with mild basophilia and prior to that he from 02/06/2016 showed white count of 11.2 with a normal differential. Looking back at his prior CBCs he was noted to have white count between 10-14 in February 2017 as well.  Patient has no significant medical problems other than right lower extremity injury for which he has a splint in place. Reports that his right leg has been giving him more trouble and pain recently. Also has trouble with his right knee. He admits to being under a lot of stress and does not get a good night sleep.  ECOG PS- 0  Pain scale- 3   Review of systems- Review of Systems  Constitutional: Negative for chills, fever, malaise/fatigue and weight loss.  HENT: Negative for congestion, ear discharge and nosebleeds.   Eyes: Negative for blurred vision.  Respiratory: Negative for cough, hemoptysis, sputum production, shortness of breath and wheezing.   Cardiovascular: Negative for chest pain, palpitations, orthopnea and claudication.  Gastrointestinal: Negative for abdominal pain, blood in stool, constipation, diarrhea, heartburn, melena, nausea and vomiting.  Genitourinary: Negative for dysuria, flank pain, frequency, hematuria and urgency.  Musculoskeletal: Positive for  joint pain (right knee pain and right leg pain). Negative for back pain and myalgias.  Skin: Negative for rash.  Neurological: Negative for dizziness, tingling, focal weakness, seizures, weakness and headaches.  Endo/Heme/Allergies: Does not bruise/bleed easily.  Psychiatric/Behavioral: Negative for depression and suicidal ideas. The patient does not have insomnia.        + for sleep disturbances    No Known Allergies  Patient Active Problem List   Diagnosis Date Noted  . Fracture of right tibia and fibula 06/05/2015     No past medical history on file.   Past Surgical History:  Procedure Laterality Date  . FINGER FRACTURE SURGERY     pins in left first finger  . TIBIA IM NAIL INSERTION Right 06/06/2015   Procedure: INTRAMEDULLARY (IM) NAIL TIBIAL;  Surgeon: Thornton Park, MD;  Location: [facility] ORS;  Service: Orthopedics;  Laterality: Right;  . TONSILLECTOMY    . WISDOM TOOTH EXTRACTION      Social History   Social History  . Marital status: Married    Spouse name: N/A  . Number of children: N/A  . Years of education: N/A   Occupational History  . Not on file.   Social History Main Topics  . Smoking status: Current Every Day Smoker    Packs/day: 1.00    Years: 34.00  . Smokeless tobacco: Not on file  . Alcohol use No  . Drug use: Unknown  . Sexual activity: Not on file   Other Topics Concern  . Not on file   Social History Narrative  . No narrative on file     No family history on file.   Current Outpatient  Prescriptions:  .  meloxicam (MOBIC) 15 MG tablet, TAKE ONE TABLET BY MOUTH EVERY DAY WITH MEAL, Disp: , Rfl: 2 .  mometasone (NASONEX) 50 MCG/ACT nasal spray, Place into the nose., Disp: , Rfl:    Physical exam:  Vitals:   05/10/16 1351  BP: 123/75  Pulse: 67  Resp: 18  Temp: 97.3 F (36.3 C)  TempSrc: Tympanic  Weight: 196 lb 1.6 oz (89 kg)  Height: 5' 9"  (1.753 m)   Physical Exam  Constitutional: He is oriented to person, place, and  time and well-developed, well-nourished, and in no distress.  HENT:  Head: Normocephalic and atraumatic.  Eyes: EOM are normal. Pupils are equal, round, and reactive to light.  Neck: Normal range of motion.  Cardiovascular: Normal rate, regular rhythm and normal heart sounds.   Pulmonary/Chest: Effort normal and breath sounds normal.  Abdominal: Soft. Bowel sounds are normal.  Neurological: He is alert and oriented to person, place, and time.  Skin: Skin is warm and dry.       CMP Latest Ref Rng & Units 06/08/2015  Glucose 65 - 99 mg/dL 120(H)  BUN 6 - 20 mg/dL 10  Creatinine 0.61 - 1.24 mg/dL 0.87  Sodium 135 - 145 mmol/L 141  Potassium 3.5 - 5.1 mmol/L 3.2(L)  Chloride 101 - 111 mmol/L 105  CO2 22 - 32 mmol/L 29  Calcium 8.9 - 10.3 mg/dL 8.4(L)  Total Protein 6.5 - 8.1 g/dL -  Total Bilirubin 0.3 - 1.2 mg/dL -  Alkaline Phos 38 - 126 U/L -  AST 15 - 41 U/L -  ALT 17 - 63 U/L -   CBC Latest Ref Rng & Units 06/09/2015  WBC 3.8 - 10.6 K/uL 10.7(H)  Hemoglobin 13.0 - 18.0 g/dL 12.6(L)  Hematocrit 40.0 - 52.0 % 35.7(L)  Platelets 150 - 440 K/uL 245    Assessment and plan- Patient is a 51 y.o. male who has been referred to [place] for evaluation of leukocytosis mainly neutrophilia  1. Today I will obtain a CBC with manual differential, ESR CRP. I will also obtain peripheral flow cytometry and pathology review of his peripheral smear. I will also check JAK2 and BCR abl to r/o Polycythemia vera and CML respectively given his mild polycythemia and basophilia respectively.  He has mild leukocytosis mainly neutrophilia and mild polycythemia which is likely secondary to his smoking and/or undiagnosed possible sleep apnea. I will see the patient back in 3 weeks' time to discuss the results of the blood work. If the blood work is unremarkable and inclined to monitor his CBC conservatively without further intervention such as a bone marrow biopsy   Thank you for this kind referral and the  opportunity to participate in the care of this patient   Visit Diagnosis No diagnosis found.  Dr. Randa Evens, MD, MPH [place] at [facility] Pager- [phone] 05/10/2016

## 2016-05-11 ENCOUNTER — Encounter: Payer: Self-pay | Admitting: Oncology

## 2016-05-11 LAB — PATHOLOGIST SMEAR REVIEW

## 2016-05-14 LAB — COMP PANEL: LEUKEMIA/LYMPHOMA

## 2016-05-15 LAB — BCR-ABL1, CML/ALL, PCR, QUANT

## 2016-05-18 LAB — JAK2 EXONS 12-15

## 2016-05-24 ENCOUNTER — Encounter: Payer: Self-pay | Admitting: Oncology

## 2016-05-24 ENCOUNTER — Inpatient Hospital Stay (HOSPITAL_BASED_OUTPATIENT_CLINIC_OR_DEPARTMENT_OTHER): Payer: No Typology Code available for payment source | Admitting: Oncology

## 2016-05-24 VITALS — BP 120/78 | HR 81 | Temp 97.5°F | Resp 18 | Wt 198.4 lb

## 2016-05-24 DIAGNOSIS — M25561 Pain in right knee: Secondary | ICD-10-CM

## 2016-05-24 DIAGNOSIS — D751 Secondary polycythemia: Secondary | ICD-10-CM

## 2016-05-24 DIAGNOSIS — M79604 Pain in right leg: Secondary | ICD-10-CM

## 2016-05-24 DIAGNOSIS — D72829 Elevated white blood cell count, unspecified: Secondary | ICD-10-CM

## 2016-05-24 DIAGNOSIS — Z801 Family history of malignant neoplasm of trachea, bronchus and lung: Secondary | ICD-10-CM

## 2016-05-24 DIAGNOSIS — Z808 Family history of malignant neoplasm of other organs or systems: Secondary | ICD-10-CM

## 2016-05-24 DIAGNOSIS — Z803 Family history of malignant neoplasm of breast: Secondary | ICD-10-CM

## 2016-05-24 DIAGNOSIS — Z8781 Personal history of (healed) traumatic fracture: Secondary | ICD-10-CM

## 2016-05-24 DIAGNOSIS — F17210 Nicotine dependence, cigarettes, uncomplicated: Secondary | ICD-10-CM

## 2016-05-24 DIAGNOSIS — Z800 Family history of malignant neoplasm of digestive organs: Secondary | ICD-10-CM

## 2016-05-24 DIAGNOSIS — G4700 Insomnia, unspecified: Secondary | ICD-10-CM

## 2016-05-24 NOTE — Progress Notes (Signed)
Patient is here for follow up, he does mention some rapid heart rate when sleeping it will wake him up

## 2016-05-24 NOTE — Progress Notes (Signed)
Hematology/Oncology Consult note [facility]  Telephone:([phone] Fax:([phone]  Patient Care Team: Katheren Shams as PCP - General   Name of the patient: Tommy [name]  [id number]  11-16-1965   Date of visit: 05/24/16  Diagnosis- Leucocytosis likely reactive  Chief complaint/ Reason for visit- discuss results of bloodwork  Heme/Onc history: patient is a 51 year old male who is a current every day smoker and has smoked 1 pack per day for the last 30 years. Labs from 05/03/2016 showed white count of 14.4, H&H of 16.4/46 with a platelet count of 316. Differential on the CBC showed predominant neutrophilia and lymphocytosis. Prior to that from 03/08/2016 showed white count of 12 with mild basophilia and prior to that he from 02/06/2016 showed white count of 11.2 with a normal differential. Looking back at his prior CBCs he was noted to have white count between 10-14 in February 2017 as well.  The results of blood work from 05/10/2016 were as follows: CBC showed white count of 11.5, H&H of 16.4/47.3 and a platelet count of 287. Differential mainly showed neutrophilia with an elevated absolute neutrophil count of 6.9. Jak 2 and exon 12 mutation testing was negative. Testing for BCR abl was negative. ESR was normal at 1. Review of peripheral smear showed mild leukocytosis with absolute neutrophilia. Dysplasia and myelocyte bulge are not identified. Platelets and erythrocytes are unremarkable. Peripheral flow cytometry did not reveal any significant immunophenotyping abnormality.  Interval history- has chronic right knee and leg pain    Review of systems- Review of Systems  Constitutional: Negative for chills, fever, malaise/fatigue and weight loss.  HENT: Negative for congestion, ear discharge and nosebleeds.   Eyes: Negative for blurred vision.  Respiratory: Negative for cough, hemoptysis, sputum production, shortness of breath and wheezing.     Cardiovascular: Negative for chest pain, palpitations, orthopnea and claudication.  Gastrointestinal: Negative for abdominal pain, blood in stool, constipation, diarrhea, heartburn, melena, nausea and vomiting.  Genitourinary: Negative for dysuria, flank pain, frequency, hematuria and urgency.  Musculoskeletal: Positive for joint pain (right knee and leg pain). Negative for back pain and myalgias.  Skin: Negative for rash.  Neurological: Negative for dizziness, tingling, focal weakness, seizures, weakness and headaches.  Endo/Heme/Allergies: Does not bruise/bleed easily.  Psychiatric/Behavioral: Negative for depression and suicidal ideas. The patient does not have insomnia.      Current treatment- observation  No Known Allergies   Past Medical History:  Diagnosis Date  . Allergy      Past Surgical History:  Procedure Laterality Date  . FINGER FRACTURE SURGERY     pins in left first finger  . TIBIA IM NAIL INSERTION Right 06/06/2015   Procedure: INTRAMEDULLARY (IM) NAIL TIBIAL;  Surgeon: Thornton Park, MD;  Location: [facility] ORS;  Service: Orthopedics;  Laterality: Right;  . TONSILLECTOMY    . WISDOM TOOTH EXTRACTION      Social History   Social History  . Marital status: Married    Spouse name: N/A  . Number of children: N/A  . Years of education: N/A   Occupational History  . Not on file.   Social History Main Topics  . Smoking status: Current Every Day Smoker    Packs/day: 1.00    Years: 34.00  . Smokeless tobacco: Never Used  . Alcohol use No  . Drug use: No  . Sexual activity: Yes   Other Topics Concern  . Not on file   Social History Narrative  . No narrative on file  Family History  Problem Relation Age of Onset  . Stomach cancer Mother   . Thyroid cancer Mother   . Diabetes Father   . Liver disease Brother   . Breast cancer Maternal Aunt   . Colon cancer Maternal Uncle   . Heart disease Maternal Grandfather   . Breast cancer Maternal Aunt    . Kidney cancer Maternal Uncle   . Lung cancer Maternal Uncle      Current Outpatient Prescriptions:  .  meloxicam (MOBIC) 15 MG tablet, TAKE ONE TABLET BY MOUTH EVERY DAY WITH MEAL, Disp: , Rfl: 2 .  mometasone (NASONEX) 50 MCG/ACT nasal spray, Place into the nose., Disp: , Rfl:   Physical exam:  Vitals:   05/24/16 1439  BP: 120/78  Pulse: 81  Resp: 18  Temp: 97.5 F (36.4 C)  TempSrc: Tympanic  Weight: 198 lb 6.6 oz (90 kg)   Physical Exam  Constitutional: He is oriented to person, place, and time and well-developed, well-nourished, and in no distress.  HENT:  Head: Normocephalic and atraumatic.  Eyes: EOM are normal. Pupils are equal, round, and reactive to light.  Neck: Normal range of motion.  Cardiovascular: Normal rate, regular rhythm and normal heart sounds.   Pulmonary/Chest: Effort normal and breath sounds normal.  Abdominal: Soft. Bowel sounds are normal.  Neurological: He is alert and oriented to person, place, and time.  Skin: Skin is warm and dry.     CMP Latest Ref Rng & Units 06/08/2015  Glucose 65 - 99 mg/dL 120(H)  BUN 6 - 20 mg/dL 10  Creatinine 0.61 - 1.24 mg/dL 0.87  Sodium 135 - 145 mmol/L 141  Potassium 3.5 - 5.1 mmol/L 3.2(L)  Chloride 101 - 111 mmol/L 105  CO2 22 - 32 mmol/L 29  Calcium 8.9 - 10.3 mg/dL 8.4(L)  Total Protein 6.5 - 8.1 g/dL -  Total Bilirubin 0.3 - 1.2 mg/dL -  Alkaline Phos 38 - 126 U/L -  AST 15 - 41 U/L -  ALT 17 - 63 U/L -   CBC Latest Ref Rng & Units 05/10/2016  WBC 3.8 - 10.6 K/uL 11.5(H)  Hemoglobin 13.0 - 18.0 g/dL 16.4  Hematocrit 40.0 - 52.0 % 47.3  Platelets 150 - 440 K/uL 287     Assessment and plan- Patient is a 51 y.o. male who was referred to [place] for leukocytosis likely reactive  1. I discussed the results of blood work with the patient which does not reveal any evidence of primary hematologic disorder as a cause of his leukocytosis. Patient has mild waxing and waning leukocytosis that is mainly  neutrophilia probably secondary to smoking. At this point he does not need any further hematologic intervention or follow-up. He can continue to follow up with his primary care doctor and can be referred to [place] in the future if any questions or concerns arise. He can proceed with rthopedic intervention from hematological standpoint there are no contraindications as such.   Visit Diagnosis 1. Leukocytosis, unspecified type      Dr. Randa Evens, MD, MPH [facility] Pager- [phone] 05/24/2016 11:35 AM

## 2016-11-09 IMAGING — CR DG TIBIA/FIBULA 2V*R*
4 series · 4 of 4 positions shown · non-contrast
Comparison: 06/05/2015.

ADDENDUM:
The report should read ORIF right tibia with good anatomic
alignment. Hardware intact. Proximal fibular fracture again noted.
CLINICAL DATA: ORIF.

EXAM:
DG C-ARM 61-120 MIN

[1]
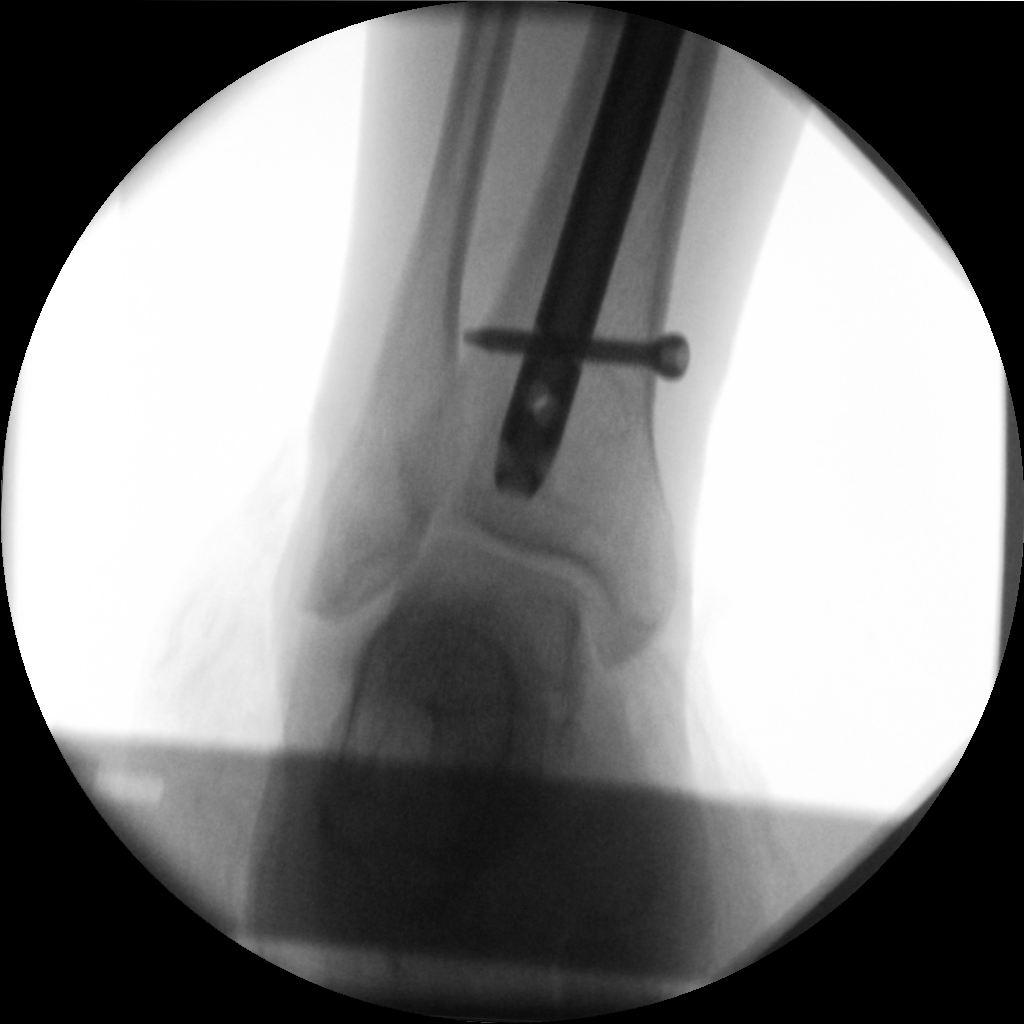

[4]
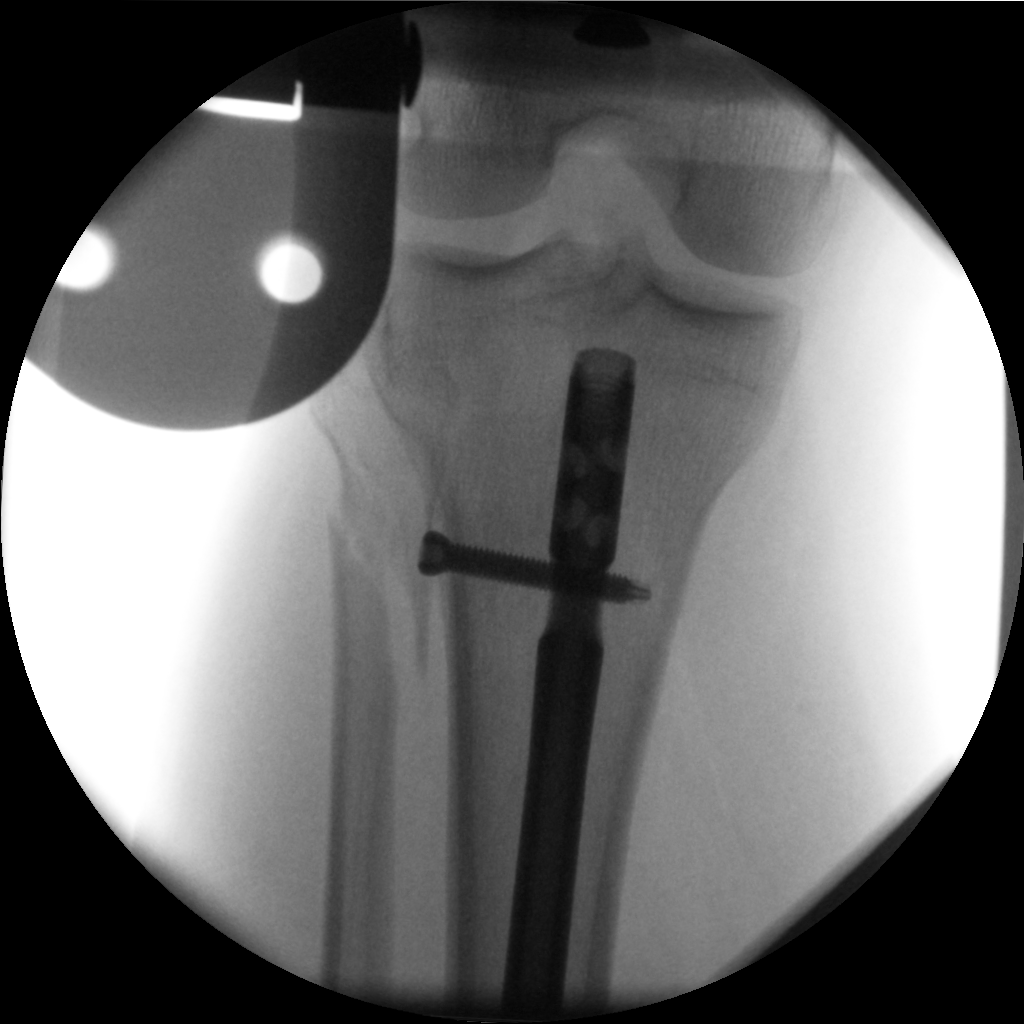

[6]
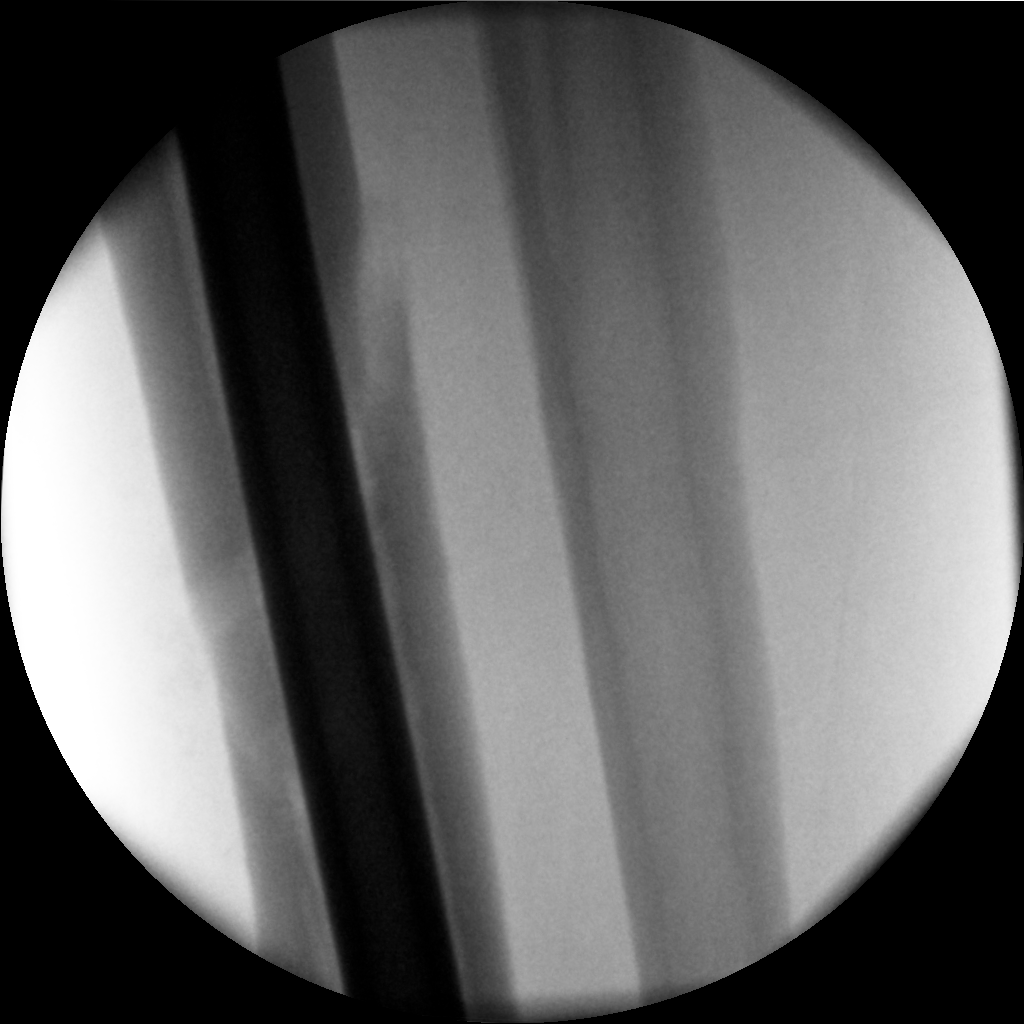

[7]
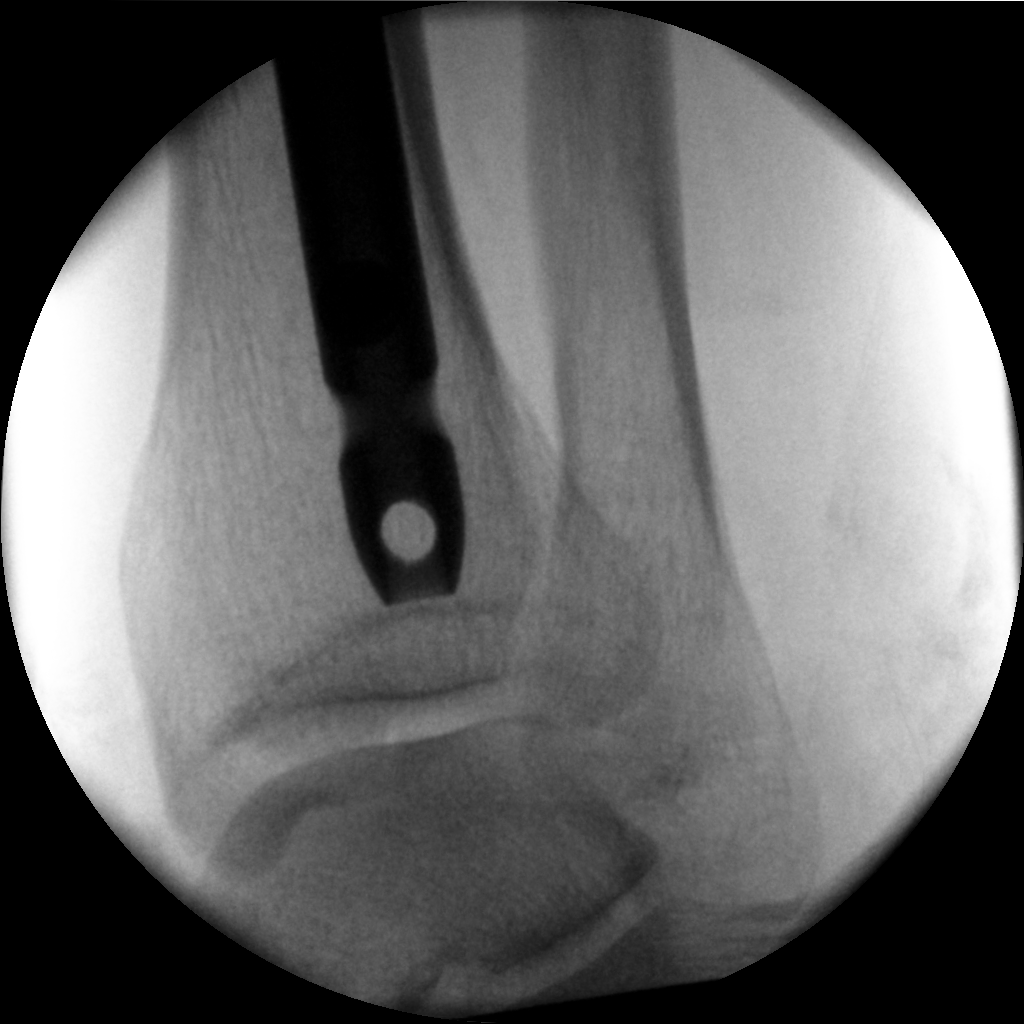

[4 of 4 positions shown; findings below may reference images not displayed]

FINDINGS: ORIF left tibia good anatomic alignment. Hardware intact. Proximal
fibular fracture noted.
IMPRESSION: ORIF left tibia with good anatomic alignment. Proximal fibular
fracture noted.

## 2018-04-12 DIAGNOSIS — R002 Palpitations: Secondary | ICD-10-CM | POA: Insufficient documentation

## 2018-04-12 DIAGNOSIS — E782 Mixed hyperlipidemia: Secondary | ICD-10-CM | POA: Insufficient documentation

## 2018-06-20 DIAGNOSIS — R7303 Prediabetes: Secondary | ICD-10-CM | POA: Insufficient documentation

## 2019-06-21 DIAGNOSIS — F418 Other specified anxiety disorders: Secondary | ICD-10-CM | POA: Insufficient documentation

## 2020-01-01 ENCOUNTER — Other Ambulatory Visit: Payer: Self-pay

## 2020-01-01 ENCOUNTER — Telehealth (INDEPENDENT_AMBULATORY_CARE_PROVIDER_SITE_OTHER): Payer: Self-pay | Admitting: Gastroenterology

## 2020-01-01 DIAGNOSIS — Z1211 Encounter for screening for malignant neoplasm of colon: Secondary | ICD-10-CM

## 2020-01-01 NOTE — Progress Notes (Signed)
Gastroenterology Pre-Procedure Review  Request Date: Monday 01/14/20 Requesting Physician: Dr. Allen Norris  PATIENT REVIEW QUESTIONS: The patient responded to the following health history questions as indicated:    1. Are you having any GI issues? no 2. Do you have a personal history of Polyps? no 3. Do you have a family history of Colon Cancer or Polyps? no 4. Diabetes Mellitus? no 5. Joint replacements in the past 12 months?no 6. Major health problems in the past 3 months?no 7. Any artificial heart valves, MVP, or defibrillator?no    MEDICATIONS & ALLERGIES:    Patient reports the following regarding taking any anticoagulation/antiplatelet therapy:   Plavix, Coumadin, Eliquis, Xarelto, Lovenox, Pradaxa, Brilinta, or Effient? no Aspirin? no  Patient confirms/reports the following medications:  Current Outpatient Medications  Medication Sig Dispense Refill  . mometasone (NASONEX) 50 MCG/ACT nasal spray Place into the nose.    . meloxicam (MOBIC) 15 MG tablet TAKE ONE TABLET BY MOUTH EVERY DAY WITH MEAL (Patient not taking: Reported on 01/01/2020)  2   No current facility-administered medications for this visit.    Patient confirms/reports the following allergies:  No Known Allergies  No orders of the defined types were placed in this encounter.   AUTHORIZATION INFORMATION Primary Insurance: 1D#: Group #:  Secondary Insurance: 1D#: Group #:  SCHEDULE INFORMATION: Date: Monday 01/14/20 Time: Location:[facility]

## 2020-01-03 ENCOUNTER — Encounter: Payer: Self-pay | Admitting: Gastroenterology

## 2020-01-07 ENCOUNTER — Telehealth: Payer: Self-pay | Admitting: Gastroenterology

## 2020-01-07 NOTE — Telephone Encounter (Signed)
Patient called to reschedule procedure.. Clinical staff was informed

## 2020-01-08 ENCOUNTER — Other Ambulatory Visit: Payer: Self-pay

## 2020-01-08 DIAGNOSIS — Z1211 Encounter for screening for malignant neoplasm of colon: Secondary | ICD-10-CM

## 2020-01-10 ENCOUNTER — Other Ambulatory Visit: Admission: RE | Admit: 2020-01-10 | Payer: No Typology Code available for payment source | Source: Ambulatory Visit

## 2020-01-17 ENCOUNTER — Other Ambulatory Visit
Admission: RE | Admit: 2020-01-17 | Discharge: 2020-01-17 | Disposition: A | Discharge status: Home / Self Care | Payer: No Typology Code available for payment source | Source: Ambulatory Visit | Attending: Gastroenterology | Admitting: Gastroenterology

## 2020-01-17 ENCOUNTER — Other Ambulatory Visit: Payer: Self-pay

## 2020-01-17 DIAGNOSIS — Z20822 Contact with and (suspected) exposure to covid-19: Secondary | ICD-10-CM | POA: Diagnosis not present

## 2020-01-17 DIAGNOSIS — Z01812 Encounter for preprocedural laboratory examination: Secondary | ICD-10-CM | POA: Insufficient documentation

## 2020-01-17 LAB — SARS CORONAVIRUS 2 (TAT 6-24 HRS): SARS Coronavirus 2: NEGATIVE

## 2020-01-18 NOTE — Discharge Instructions (Signed)
General Anesthesia, Adult, Care After This sheet gives you information about how to care for yourself after your procedure. Your health care provider may also give you more specific instructions. If you have problems or questions, contact your health care provider. What can I expect after the procedure? After the procedure, the following side effects are common:  Pain or discomfort at the IV site.  Nausea.  Vomiting.  Sore throat.  Trouble concentrating.  Feeling cold or chills.  Weak or tired.  Sleepiness and fatigue.  Soreness and body aches. These side effects can affect parts of the body that were not involved in surgery. Follow these instructions at home:  For at least 24 hours after the procedure:  Have a responsible adult stay with you. It is important to have someone help care for you until you are awake and alert.  Rest as needed.  Do not: ? Participate in activities in which you could fall or become injured. ? Drive. ? Use heavy machinery. ? Drink alcohol. ? Take sleeping pills or medicines that cause drowsiness. ? Make important decisions or sign legal documents. ? Take care of children on your own. Eating and drinking  Follow any instructions from your health care provider about eating or drinking restrictions.  When you feel hungry, start by eating small amounts of foods that are soft and easy to digest (bland), such as toast. Gradually return to your regular diet.  Drink enough fluid to keep your urine pale yellow.  If you vomit, rehydrate by drinking water, juice, or clear broth. General instructions  If you have sleep apnea, surgery and certain medicines can increase your risk for breathing problems. Follow instructions from your health care provider about wearing your sleep device: ? Anytime you are sleeping, including during daytime naps. ? While taking prescription pain medicines, sleeping medicines, or medicines that make you drowsy.  Return to  your normal activities as told by your health care provider. Ask your health care provider what activities are safe for you.  Take over-the-counter and prescription medicines only as told by your health care provider.  If you smoke, do not smoke without supervision.  Keep all follow-up visits as told by your health care provider. This is important. Contact a health care provider if:  You have nausea or vomiting that does not get better with medicine.  You cannot eat or drink without vomiting.  You have pain that does not get better with medicine.  You are unable to pass urine.  You develop a skin rash.  You have a fever.  You have redness around your IV site that gets worse. Get help right away if:  You have difficulty breathing.  You have chest pain.  You have blood in your urine or stool, or you vomit blood. Summary  After the procedure, it is common to have a sore throat or nausea. It is also common to feel tired.  Have a responsible adult stay with you for the first 24 hours after general anesthesia. It is important to have someone help care for you until you are awake and alert.  When you feel hungry, start by eating small amounts of foods that are soft and easy to digest (bland), such as toast. Gradually return to your regular diet.  Drink enough fluid to keep your urine pale yellow.  Return to your normal activities as told by your health care provider. Ask your health care provider what activities are safe for you. This information is not   intended to replace advice given to you by your health care provider. Make sure you discuss any questions you have with your health care provider. Document Revised: 04/15/2017 Document Reviewed: 11/26/2016 Elsevier Patient Education  2020 Elsevier Inc.  

## 2020-01-20 NOTE — Anesthesia Preprocedure Evaluation (Addendum)
Anesthesia Evaluation  Patient identified by MRN, date of birth, ID band Patient awake    Reviewed: Allergy & Precautions, NPO status , Patient's Chart, lab work & pertinent test results  History of Anesthesia Complications Negative for: history of anesthetic complications  Airway Mallampati: IV   Neck ROM: Full    Dental no notable dental hx.    Pulmonary Current Smoker (1/2 ppd) and Patient abstained from smoking.,    Pulmonary exam normal breath sounds clear to auscultation       Cardiovascular Exercise Tolerance: Good negative cardio ROS Normal cardiovascular exam Rhythm:Regular Rate:Normal     Neuro/Psych negative neurological ROS     GI/Hepatic negative GI ROS,   Endo/Other  negative endocrine ROSPrediabetes   Renal/GU negative Renal ROS     Musculoskeletal   Abdominal   Peds  Hematology negative hematology ROS (+)   Anesthesia Other Findings   Reproductive/Obstetrics                            Anesthesia Physical Anesthesia Plan  ASA: II  Anesthesia Plan: General   Post-op Pain Management:    Induction: Intravenous  PONV Risk Score and Plan: 1 and Propofol infusion, TIVA and Treatment may vary due to age or medical condition  Airway Management Planned: Natural Airway  Additional Equipment:   Intra-op Plan:   Post-operative Plan:   Informed Consent: I have reviewed the patients History and Physical, chart, labs and discussed the procedure including the risks, benefits and alternatives for the proposed anesthesia with the patient or authorized representative who has indicated his/her understanding and acceptance.       Plan Discussed with: CRNA  Anesthesia Plan Comments:        Anesthesia Quick Evaluation

## 2020-01-21 ENCOUNTER — Ambulatory Visit
Admission: RE | Admit: 2020-01-21 | Discharge: 2020-01-21 | Disposition: A | Discharge status: Home / Self Care | Payer: No Typology Code available for payment source | Attending: Gastroenterology | Admitting: Gastroenterology

## 2020-01-21 ENCOUNTER — Ambulatory Visit: Payer: No Typology Code available for payment source | Admitting: Anesthesiology

## 2020-01-21 ENCOUNTER — Ambulatory Visit
Admission: RE | Disposition: A | Discharge status: Home / Self Care | Payer: Self-pay | Source: Home / Self Care | Attending: Gastroenterology

## 2020-01-21 ENCOUNTER — Encounter: Payer: Self-pay | Admitting: Gastroenterology

## 2020-01-21 ENCOUNTER — Other Ambulatory Visit: Payer: Self-pay

## 2020-01-21 DIAGNOSIS — D123 Benign neoplasm of transverse colon: Secondary | ICD-10-CM | POA: Insufficient documentation

## 2020-01-21 DIAGNOSIS — K635 Polyp of colon: Secondary | ICD-10-CM | POA: Diagnosis not present

## 2020-01-21 DIAGNOSIS — Z833 Family history of diabetes mellitus: Secondary | ICD-10-CM | POA: Diagnosis not present

## 2020-01-21 DIAGNOSIS — K621 Rectal polyp: Secondary | ICD-10-CM

## 2020-01-21 DIAGNOSIS — D122 Benign neoplasm of ascending colon: Secondary | ICD-10-CM | POA: Diagnosis not present

## 2020-01-21 DIAGNOSIS — K648 Other hemorrhoids: Secondary | ICD-10-CM | POA: Insufficient documentation

## 2020-01-21 DIAGNOSIS — R7303 Prediabetes: Secondary | ICD-10-CM | POA: Insufficient documentation

## 2020-01-21 DIAGNOSIS — Z1211 Encounter for screening for malignant neoplasm of colon: Secondary | ICD-10-CM | POA: Diagnosis not present

## 2020-01-21 DIAGNOSIS — D125 Benign neoplasm of sigmoid colon: Secondary | ICD-10-CM | POA: Diagnosis not present

## 2020-01-21 DIAGNOSIS — F17210 Nicotine dependence, cigarettes, uncomplicated: Secondary | ICD-10-CM | POA: Insufficient documentation

## 2020-01-21 DIAGNOSIS — D124 Benign neoplasm of descending colon: Secondary | ICD-10-CM | POA: Insufficient documentation

## 2020-01-21 DIAGNOSIS — Z800 Family history of malignant neoplasm of digestive organs: Secondary | ICD-10-CM | POA: Diagnosis not present

## 2020-01-21 HISTORY — DX: Prediabetes: R73.03

## 2020-01-21 HISTORY — PX: POLYPECTOMY: SHX5525

## 2020-01-21 HISTORY — DX: Other complications of anesthesia, initial encounter: T88.59XA

## 2020-01-21 HISTORY — PX: COLONOSCOPY WITH PROPOFOL: SHX5780

## 2020-01-21 SURGERY — COLONOSCOPY WITH PROPOFOL
Anesthesia: General | Site: Rectum

## 2020-01-21 MED ORDER — PROPOFOL 10 MG/ML IV BOLUS
INTRAVENOUS | Status: DC | PRN
  Administered 2020-01-21 (×2): 30 mg via INTRAVENOUS
  Administered 2020-01-21: 40 mg via INTRAVENOUS
  Administered 2020-01-21 (×2): 30 mg via INTRAVENOUS
  Administered 2020-01-21: 60 mg via INTRAVENOUS

## 2020-01-21 MED ORDER — LACTATED RINGERS IV SOLN: INTRAVENOUS | Status: DC | PRN

## 2020-01-21 MED ORDER — STERILE WATER FOR IRRIGATION IR SOLN: Status: DC | PRN

## 2020-01-21 MED ORDER — LIDOCAINE HCL (CARDIAC) PF 100 MG/5ML IV SOSY
PREFILLED_SYRINGE | INTRAVENOUS | Status: DC | PRN
  Administered 2020-01-21: 60 mg via INTRAVENOUS

## 2020-01-21 SURGICAL SUPPLY — 7 items
GOWN CVR UNV OPN BCK APRN NK (MISCELLANEOUS) ×2 IMPLANT
GOWN ISOL THUMB LOOP REG UNIV (MISCELLANEOUS) ×4
KIT ENDO PROCEDURE OLY (KITS) ×2 IMPLANT
MANIFOLD NEPTUNE II (INSTRUMENTS) ×2 IMPLANT
SNARE SHORT THROW 13M SML OVAL (MISCELLANEOUS) ×2 IMPLANT
TRAP ETRAP POLY (MISCELLANEOUS) ×2 IMPLANT
WATER STERILE IRR 250ML POUR (IV SOLUTION) ×2 IMPLANT

## 2020-01-21 NOTE — Op Note (Signed)
[facility] Gastroenterology Patient Name: Tommy [name] Procedure Date: 01/21/2020 10:27 AM MRN: [id number] Account #: [id number] Date of Birth: 07-25-1965 Admit Type: Outpatient Age: 54 Room: [facility] OR ROOM 01 Gender: Male Note Status: Finalized Procedure:             Colonoscopy Indications:           Screening for colorectal malignant neoplasm Providers:             [name] MD, MD Referring MD:          Dion Body (Referring MD) Medicines:             Propofol per Anesthesia Complications:         No immediate complications. Procedure:             Pre-Anesthesia Assessment:                        - Prior to the procedure, a History and Physical was                         performed, and patient medications and allergies were                         reviewed. The patient's tolerance of previous                         anesthesia was also reviewed. The risks and benefits                         of the procedure and the sedation options and risks                         were discussed with the patient. All questions were                         answered, and informed consent was obtained. Prior                         Anticoagulants: The patient has taken no previous                         anticoagulant or antiplatelet agents. ASA Grade                         Assessment: II - A patient with mild systemic disease.                         After reviewing the risks and benefits, the patient                         was deemed in satisfactory condition to undergo the                         procedure.                        After obtaining informed consent, the colonoscope was  passed under direct vision. Throughout the procedure,                         the patient's blood pressure, pulse, and oxygen                         saturations were monitored continuously. The                         Colonoscope was introduced through the  anus and                         advanced to the the cecum, identified by appendiceal                         orifice and ileocecal valve. The colonoscopy was                         performed without difficulty. The patient tolerated                         the procedure well. The quality of the bowel                         preparation was excellent. Findings:      The perianal and digital rectal examinations were normal.      A 3 mm polyp was found in the ascending colon. The polyp was sessile.       The polyp was removed with a cold snare. Resection and retrieval were       complete.      A 7 mm polyp was found in the transverse colon. The polyp was sessile.       The polyp was removed with a cold snare. Resection and retrieval were       complete.      A 3 mm polyp was found in the descending colon. The polyp was sessile.       The polyp was removed with a cold snare. Resection and retrieval were       complete.      A 4 mm polyp was found in the sigmoid colon. The polyp was sessile. The       polyp was removed with a cold snare. Resection and retrieval were       complete.      A 4 mm polyp was found in the rectum. The polyp was sessile. The polyp       was removed with a cold snare. Resection and retrieval were complete.      Non-bleeding internal hemorrhoids were found during retroflexion. The       hemorrhoids were Grade I (internal hemorrhoids that do not prolapse). Impression:            - One 3 mm polyp in the ascending colon, removed with                         a cold snare. Resected and retrieved.                        - One 7 mm polyp in the transverse colon, removed with  a cold snare. Resected and retrieved.                        - One 3 mm polyp in the descending colon, removed with                         a cold snare. Resected and retrieved.                        - One 4 mm polyp in the sigmoid colon, removed with a                          cold snare. Resected and retrieved.                        - One 4 mm polyp in the rectum, removed with a cold                         snare. Resected and retrieved.                        - Non-bleeding internal hemorrhoids. Recommendation:        - Discharge patient to home.                        - Resume previous diet.                        - Continue present medications.                        - Await pathology results.                        - Repeat colonoscopy in 5 years if polyp adenoma and                         10 years if hyperplastic Procedure Code(s):     --- Professional ---                        [phone], Colonoscopy, flexible; with removal of                         tumor(s), polyp(s), or other lesion(s) by snare                         technique Diagnosis Code(s):     --- Professional ---                        Z12.11, Encounter for screening for malignant neoplasm                         of colon                        K63.5, Polyp of colon                        K62.1, Rectal polyp CPT copyright 2019 American Medical Association. All rights  reserved. The codes documented in this report are preliminary and upon coder review may  be revised to meet current compliance requirements. [name] MD, MD 01/21/2020 10:54:48 AM This report has been signed electronically. Number of Addenda: 0 Note Initiated On: 01/21/2020 10:27 AM Scope Withdrawal Time: 0 hours 8 minutes 32 seconds  Total Procedure Duration: 0 hours 12 minutes 18 seconds  Estimated Blood Loss:  Estimated blood loss: none.      [facility]

## 2020-01-21 NOTE — H&P (Signed)
Tommy Lame, MD [address] [name], [name] [address] Phone: [phone] Fax : [phone]  Primary Care Physician:  Dion Body, MD Primary Gastroenterologist:  Dr. Allen Norris  Pre-Procedure History & Physical: HPI:  Tommy [name] is a 54 y.o. male is here for a screening colonoscopy.   Past Medical History:  Diagnosis Date  . Allergy   . Complication of anesthesia    took a while to wake up after right leg surgery  . Prediabetes     Past Surgical History:  Procedure Laterality Date  . FINGER FRACTURE SURGERY     pins in left first finger  . TIBIA IM NAIL INSERTION Right 06/06/2015   Procedure: INTRAMEDULLARY (IM) NAIL TIBIAL;  Surgeon: Thornton Park, MD;  Location: [facility] ORS;  Service: Orthopedics;  Laterality: Right;  . TONSILLECTOMY    . WISDOM TOOTH EXTRACTION      Prior to Admission medications   Medication Sig Start Date End Date Taking? Authorizing Provider  cetirizine (ZYRTEC) 10 MG tablet Take 10 mg by mouth daily.   Yes [provider]  mometasone (NASONEX) 50 MCG/ACT nasal spray Place into the nose. 02/02/16  Yes [provider]  vitamin B-12 (CYANOCOBALAMIN) 500 MCG tablet Take 500 mcg by mouth daily.   Yes [provider]  meloxicam (MOBIC) 15 MG tablet TAKE ONE TABLET BY MOUTH EVERY DAY WITH MEAL Patient not taking: Reported on 01/01/2020 03/22/16   [provider]    Allergies as of 01/01/2020  . (No Known Allergies)    Family History  Problem Relation Age of Onset  . Stomach cancer Mother   . Thyroid cancer Mother   . Diabetes Father   . Liver disease Brother   . Breast cancer Maternal Aunt   . Colon cancer Maternal Uncle   . Heart disease Maternal Grandfather   . Breast cancer Maternal Aunt   . Kidney cancer Maternal Uncle   . Lung cancer Maternal Uncle     Social History   Socioeconomic History  . Marital status: Married    Spouse name: Not on file  . Number of children: Not on  file  . Years of education: Not on file  . Highest education level: Not on file  Occupational History  . Not on file  Tobacco Use  . Smoking status: Current Every Day Smoker    Packs/day: 0.50    Years: 34.00    Pack years: 17.00  . Smokeless tobacco: Never Used  Substance and Sexual Activity  . Alcohol use: No  . Drug use: No  . Sexual activity: Yes  Other Topics Concern  . Not on file  Social History Narrative  . Not on file   Social Determinants of Health   Financial Resource Strain:   . Difficulty of Paying Living Expenses: Not on file  Food Insecurity:   . Worried About Charity fundraiser in the Last Year: Not on file  . Ran Out of Food in the Last Year: Not on file  Transportation Needs:   . Lack of Transportation (Medical): Not on file  . Lack of Transportation (Non-Medical): Not on file  Physical Activity:   . Days of Exercise per Week: Not on file  . Minutes of Exercise per Session: Not on file  Stress:   . Feeling of Stress : Not on file  Social Connections:   . Frequency of Communication with Friends and Family: Not on file  . Frequency of Social Gatherings with  Friends and Family: Not on file  . Attends Religious Services: Not on file  . Active Member of Clubs or Organizations: Not on file  . Attends Archivist Meetings: Not on file  . Marital Status: Not on file  Intimate Partner Violence:   . Fear of Current or Ex-Partner: Not on file  . Emotionally Abused: Not on file  . Physically Abused: Not on file  . Sexually Abused: Not on file    Review of Systems: See HPI, otherwise negative ROS  Physical Exam: BP 120/66   Pulse 70   Temp 97.7 F (36.5 C) (Temporal)   Resp 18   Ht 5\' 10"  (1.778 m)   Wt 88 kg   SpO2 98%   BMI 27.84 kg/m  General:   Alert,  pleasant and cooperative in NAD Head:  Normocephalic and atraumatic. Neck:  Supple; no masses or thyromegaly. Lungs:  Clear throughout to auscultation.    Heart:  Regular rate and  rhythm. Abdomen:  Soft, nontender and nondistended. Normal bowel sounds, without guarding, and without rebound.   Neurologic:  Alert and  oriented x4;  grossly normal neurologically.  Impression/Plan: [name] is now here to undergo a screening colonoscopy.  Risks, benefits, and alternatives regarding colonoscopy have been reviewed with the patient.  Questions have been answered.  All parties agreeable.

## 2020-01-21 NOTE — Anesthesia Postprocedure Evaluation (Signed)
Anesthesia Post Note  Patient: Tommy [name]  Procedure(s) Performed: COLONOSCOPY WITH BIOPSY (N/A Rectum) POLYPECTOMY (N/A Rectum)     Patient location during evaluation: PACU Anesthesia Type: General Level of consciousness: awake and alert, oriented and patient cooperative Pain management: pain level controlled Vital Signs Assessment: post-procedure vital signs reviewed and stable Respiratory status: spontaneous breathing, nonlabored ventilation and respiratory function stable Cardiovascular status: blood pressure returned to baseline and stable Postop Assessment: adequate PO intake Anesthetic complications: no   No complications documented.  Darrin Nipper

## 2020-01-21 NOTE — Transfer of Care (Signed)
Immediate Anesthesia Transfer of Care Note  Patient: Tommy [name]  Procedure(s) Performed: COLONOSCOPY WITH BIOPSY (N/A Rectum) POLYPECTOMY (N/A Rectum)  Patient Location: PACU  Anesthesia Type: General  Level of Consciousness: awake, alert  and patient cooperative  Airway and Oxygen Therapy: Patient Spontanous Breathing and Patient connected to supplemental oxygen  Post-op Assessment: Post-op Vital signs reviewed, Patient's Cardiovascular Status Stable, Respiratory Function Stable, Patent Airway and No signs of Nausea or vomiting  Post-op Vital Signs: Reviewed and stable  Complications: No complications documented.

## 2020-01-21 NOTE — Anesthesia Procedure Notes (Signed)
Date/Time: 01/21/2020 10:36 AM Performed by: Dionne Bucy, CRNA Oxygen Delivery Method: Nasal cannula Placement Confirmation: positive ETCO2

## 2020-01-22 ENCOUNTER — Encounter: Payer: Self-pay | Admitting: Gastroenterology

## 2020-01-23 LAB — SURGICAL PATHOLOGY

## 2020-01-24 ENCOUNTER — Encounter: Payer: Self-pay | Admitting: Gastroenterology

## 2021-12-14 ENCOUNTER — Other Ambulatory Visit: Payer: Self-pay | Admitting: Family Medicine

## 2021-12-14 DIAGNOSIS — M5412 Radiculopathy, cervical region: Secondary | ICD-10-CM

## 2022-05-25 ENCOUNTER — Emergency Department: Payer: No Typology Code available for payment source

## 2022-05-25 ENCOUNTER — Emergency Department
Admission: EM | Admit: 2022-05-25 | Discharge: 2022-05-25 | Disposition: A | Discharge status: Home / Self Care | Payer: No Typology Code available for payment source | Attending: Emergency Medicine | Admitting: Emergency Medicine

## 2022-05-25 ENCOUNTER — Encounter: Payer: Self-pay | Admitting: Emergency Medicine

## 2022-05-25 ENCOUNTER — Other Ambulatory Visit: Payer: Self-pay

## 2022-05-25 ENCOUNTER — Other Ambulatory Visit: Payer: No Typology Code available for payment source

## 2022-05-25 DIAGNOSIS — R0789 Other chest pain: Secondary | ICD-10-CM | POA: Insufficient documentation

## 2022-05-25 LAB — TROPONIN I (HIGH SENSITIVITY): Troponin I (High Sensitivity): 4 ng/L (ref <18)

## 2022-05-25 LAB — CBC
HCT: 49.3 % (ref 39.0–52.0)
Hemoglobin: 17 g/dL (ref 13.0–17.0)
MCH: 31.4 pg (ref 26.0–34.0)
MCHC: 34.5 g/dL (ref 30.0–36.0)
MCV: 91.1 fL (ref 80.0–100.0)
Platelets: 334 10*3/uL (ref 150–400)
RBC: 5.41 MIL/uL (ref 4.22–5.81)
RDW: 12 % (ref 11.5–15.5)
WBC: 11.9 10*3/uL — ABNORMAL HIGH (ref 4.0–10.5)
nRBC: 0 % (ref 0.0–0.2)

## 2022-05-25 LAB — BASIC METABOLIC PANEL
Anion gap: 10 (ref 5–15)
BUN: 12 mg/dL (ref 6–20)
CO2: 26 mmol/L (ref 22–32)
Calcium: 9.5 mg/dL (ref 8.9–10.3)
Chloride: 101 mmol/L (ref 98–111)
Creatinine, Ser: 0.71 mg/dL (ref 0.61–1.24)
GFR, Estimated: >60 mL/min (ref >60)
Glucose, Bld: 95 mg/dL (ref 70–99)
Potassium: 4.1 mmol/L (ref 3.5–5.1)
Sodium: 137 mmol/L (ref 135–145)

## 2022-05-25 MED ORDER — IOHEXOL 350 MG/ML SOLN
75.0000 mL | Freq: Once | INTRAVENOUS | Status: AC | PRN
  Administered 2022-05-25: 75 mL via INTRAVENOUS

## 2022-05-25 NOTE — ED Triage Notes (Signed)
Pt states that he has been having left sided chest pain radiating into the left arm for the past week, states that the pain got worse last pm and started having tingling left side of his face, states that he feels like the pain is worse at night. Pt states that he takes gabapentin for neck issues

## 2022-05-25 NOTE — ED Notes (Signed)
See triage note  Presents with left sided chest pain  States he has had pain like this in the past  Pain gets worse at night  States pain was moving into left arm

## 2022-05-25 NOTE — ED Provider Notes (Signed)
[facility] Provider Note    Event Date/Time   First MD Initiated Contact with Patient 05/25/22 1147     (approximate)   History   Chest Pain   HPI  Tommy [name] is a 57 y.o. male who presents with complaints of left-sided chest pain.  Patient reports this has been ongoing for about a week but is worsened in the last several days where it is quite uncomfortable.  No worse with arm movement.  No injury to the area.  No rash.  No shortness of breath.  No fevers or cough     Physical Exam   Triage Vital Signs: ED Triage Vitals  Enc Vitals Group     BP 05/25/22 1100 118/72     Pulse Rate 05/25/22 1100 62     Resp 05/25/22 1100 16     Temp 05/25/22 1100 97.8 F (36.6 C)     Temp Source 05/25/22 1100 Oral     SpO2 05/25/22 1100 97 %     Weight 05/25/22 1101 88.5 kg (195 lb)     Height 05/25/22 1101 1.753 m ('5\' 9"'$ )     Head Circumference --      Peak Flow --      Pain Score 05/25/22 1101 3     Pain Loc --      Pain Edu? --      Excl. in [place]? --     Most recent vital signs: Vitals:   05/25/22 1100  BP: 118/72  Pulse: 62  Resp: 16  Temp: 97.8 F (36.6 C)  SpO2: 97%     General: Awake, no distress.  CV:  Good peripheral perfusion.  No chest wall tenderness, regular rate and rhythm Resp:  Normal effort.  CTA bilaterally Abd:  No distention.  Other:  No calf pain or swelling   ED Results / Procedures / Treatments   Labs (all labs ordered are listed, but only abnormal results are displayed) Labs Reviewed  CBC - Abnormal; Notable for the following components:      Result Value   WBC 11.9 (*)    All other components within normal limits  BASIC METABOLIC PANEL  TROPONIN I (HIGH SENSITIVITY)     EKG  ED ECG REPORT I, Lavonia Drafts, the attending physician, personally viewed and interpreted this ECG.  Date: 05/25/2022  Rhythm: normal sinus rhythm QRS Axis: normal Intervals: normal ST/T Wave abnormalities: normal Narrative  Interpretation: no evidence of acute ischemia    RADIOLOGY Chest x-ray viewed interpret by me, no acute abnormality    PROCEDURES:  Critical Care performed:   Procedures   MEDICATIONS ORDERED IN ED: Medications  iohexol (OMNIPAQUE) 350 MG/ML injection 75 mL (75 mLs Intravenous Contrast Given 05/25/22 1407)     IMPRESSION / MDM / ASSESSMENT AND PLAN / ED COURSE  I reviewed the triage vital signs and the nursing notes. Patient's presentation is most consistent with acute presentation with potential threat to life or bodily function.  Patient presents with chest pain as detailed above.  Differential includes ACS, chest wall pain, pneumonia, less likely PE  Lab work is overall reassuring, EKG is unremarkable.  High sensitive troponin is normal, ACS is unlikely  X-ray without evidence of pneumonia, no significant tenderness to chest wall  Will send for CT angio to rule out PE  CT scan is negative for PE dissection, patient remains well-appearing, no indication for admission at this time, appropriate for discharge with cardiology follow-up referral  placed        FINAL CLINICAL IMPRESSION(S) / ED DIAGNOSES   Final diagnoses:  Atypical chest pain     Rx / DC Orders   ED Discharge Orders          Ordered    Ambulatory referral to Cardiology       Comments: If you have not heard from the Cardiology office within the next 72 hours please call [phone].   05/25/22 1432             Note:  This document was prepared using Dragon voice recognition software and may include unintentional dictation errors.   Lavonia Drafts, MD 05/25/22 [phone]

## 2022-07-23 ENCOUNTER — Encounter: Payer: Self-pay | Admitting: Cardiology

## 2022-07-23 ENCOUNTER — Ambulatory Visit: Payer: No Typology Code available for payment source | Attending: Cardiology | Admitting: Cardiology

## 2022-07-23 VITALS — BP 124/80 | HR 60 | Ht 69.0 in | Wt 197.4 lb

## 2022-07-23 DIAGNOSIS — R079 Chest pain, unspecified: Secondary | ICD-10-CM

## 2022-07-23 DIAGNOSIS — R072 Precordial pain: Secondary | ICD-10-CM

## 2022-07-23 DIAGNOSIS — F17200 Nicotine dependence, unspecified, uncomplicated: Secondary | ICD-10-CM | POA: Diagnosis not present

## 2022-07-23 MED ORDER — NITROGLYCERIN 0.4 MG SL SUBL: SUBLINGUAL_TABLET | SUBLINGUAL | 0 refills | Status: DC

## 2022-07-23 MED ORDER — METOPROLOL TARTRATE 25 MG PO TABS: 25.0000 mg | ORAL_TABLET | Freq: Once | ORAL | 0 refills | Status: DC

## 2022-07-23 NOTE — Progress Notes (Signed)
Cardiology Office Note:    Date:  07/23/2022   ID:  Tommy [name], DOB July 08, 1965, MRN [id number]  PCP:  Dion Body, MD   [place] [facility] Cardiologist:  Kate Sable, MD     Referring MD: Lavonia Drafts, MD   Chief Complaint  Patient presents with   New Patient (Initial Visit)    Chest Pain(not current pain), Cardiac Hx    Tommy [name] is a 57 y.o. male who is being seen today for the evaluation of chest pain at the request of Lavonia Drafts, MD.   History of Present Illness:    Tommy [name] is a 57 y.o. male with a hx of DJD, prediabetes, current smoker (40) presenting due to chest pain.  Symptoms began 3 months ago while patient was at home.  He was sitting when he suddenly had chest pressure radiating down his left shoulder.  Symptoms lasted about an hour, patient presented to the ED where workup was ordered.  Advised to follow-up with cardiology.  He has had occasional chest discomfort since.  He is a retired Forensic scientist.  Denies any family history of heart attack.   Past Medical History:  Diagnosis Date   Allergy    Complication of anesthesia    took a while to wake up after right leg surgery   Prediabetes     Past Surgical History:  Procedure Laterality Date   COLONOSCOPY WITH PROPOFOL N/A 01/21/2020   Procedure: COLONOSCOPY WITH BIOPSY;  Surgeon: [name], MD;  Location: [facility];  Service: Endoscopy;  Laterality: N/A;  priority 4   FINGER FRACTURE SURGERY     pins in left first finger   POLYPECTOMY N/A 01/21/2020   Procedure: POLYPECTOMY;  Surgeon: [name], MD;  Location: [facility];  Service: Endoscopy;  Laterality: N/A;   TIBIA IM NAIL INSERTION Right 06/06/2015   Procedure: INTRAMEDULLARY (IM) NAIL TIBIAL;  Surgeon: Thornton Park, MD;  Location: [facility];  Service: Orthopedics;  Laterality: Right;   TONSILLECTOMY     WISDOM TOOTH EXTRACTION      Current Medications: Current  Meds  Medication Sig   cetirizine (ZYRTEC) 10 MG tablet Take 10 mg by mouth daily.   Cholecalciferol (D-3-5 PO) Take 25 mg by mouth daily.   gabapentin (NEURONTIN) 300 MG capsule Take 600 mg by mouth at bedtime.   metoprolol tartrate (LOPRESSOR) 25 MG tablet Take 1 tablet (25 mg total) by mouth once for 1 dose. TWO HOURS PRIOR TO CARDIAC CTA   mometasone (NASONEX) 50 MCG/ACT nasal spray Place into the nose.   nitroGLYCERIN (NITROSTAT) 0.4 MG SL tablet As needed Take NTG Place 1 tablet (0.4 mg total) under the tongue every 5 (five) minutes as needed for chest pain. Max 3 tablets. If chest pain is not relieved after 3 tablets seek urgent medical attention.   Omega-3 Fatty Acids (FISH OIL) 1000 MG CAPS Take 2,000 mg by mouth daily.   vitamin B-12 (CYANOCOBALAMIN) 500 MCG tablet Take 500 mcg by mouth daily.     Allergies:   Patient has no known allergies.   Social History   Socioeconomic History   Marital status: Married    Spouse name: Not on file   Number of children: Not on file   Years of education: Not on file   Highest education level: Not on file  Occupational History   Not on file  Tobacco Use   Smoking status: Every Day    Packs/day: 1.00  Years: 40.00    Additional pack years: 0.00    Total pack years: 40.00    Types: Cigarettes   Smokeless tobacco: Never  Substance and Sexual Activity   Alcohol use: No   Drug use: No   Sexual activity: Yes  Other Topics Concern   Not on file  Social History Narrative   Not on file   Social Determinants of Health   Financial Resource Strain: Not on file  Food Insecurity: Not on file  Transportation Needs: Not on file  Physical Activity: Not on file  Stress: Not on file  Social Connections: Not on file     Family History: The patient's family history includes Breast cancer in his maternal aunt and maternal aunt; Colon cancer in his maternal uncle; Diabetes in his father; Heart disease in his maternal grandfather; Kidney  cancer in his maternal uncle; Liver disease in his brother; Lung cancer in his maternal uncle; Stomach cancer in his mother; Thyroid cancer in his mother.  ROS:   Please see the history of present illness.     All other systems reviewed and are negative.  EKGs/Labs/Other Studies Reviewed:    The following studies were reviewed today:   EKG:  EKG is  ordered today.  The ekg ordered today demonstrates normal sinus rhythm, normal ECG.  Recent Labs: 05/25/2022: BUN 12; Creatinine, Ser 0.71; Hemoglobin 17.0; Platelets 334; Potassium 4.1; Sodium 137  Recent Lipid Panel No results found for: "CHOL", "TRIG", "HDL", "CHOLHDL", "VLDL", "LDLCALC", "LDLDIRECT"   Risk Assessment/Calculations:             Physical Exam:    VS:  BP 124/80 (BP Location: Right Arm)   Pulse 60   Ht 5\' 9"  (1.753 m)   Wt 197 lb 6.4 oz (89.5 kg)   SpO2 97%   BMI 29.15 kg/m     Wt Readings from Last 3 Encounters:  07/23/22 197 lb 6.4 oz (89.5 kg)  05/25/22 195 lb (88.5 kg)  01/21/20 194 lb (88 kg)     GEN:  Well nourished, well developed in no acute distress HEENT: Normal NECK: No JVD; No carotid bruits CARDIAC: RRR, no murmurs, rubs, gallops RESPIRATORY:  Clear to auscultation without rales, wheezing or rhonchi  ABDOMEN: Soft, non-tender, non-distended MUSCULOSKELETAL:  No edema; No deformity  SKIN: Warm and dry NEUROLOGIC:  Alert and oriented x 3 PSYCHIATRIC:  Normal affect   ASSESSMENT:    1. Precordial pain   2. Smoking   3. Chest pain, unspecified type    PLAN:    In order of problems listed above:  Chest pain, risk factors current smoker.  Obtain echo, obtain coronary CTA. Current smoker, smoking cessation advised.  Follow-up after echo inhalers.     Medication Adjustments/Labs and Tests Ordered: Current medicines are reviewed at length with the patient today.  Concerns regarding medicines are outlined above.  Orders Placed This Encounter  Procedures   CT CORONARY MORPH W/CTA COR  W/SCORE W/CA W/CM &/OR WO/CM   EKG 12-Lead   ECHOCARDIOGRAM COMPLETE   Meds ordered this encounter  Medications   metoprolol tartrate (LOPRESSOR) 25 MG tablet    Sig: Take 1 tablet (25 mg total) by mouth once for 1 dose. TWO HOURS PRIOR TO CARDIAC CTA    Dispense:  1 tablet    Refill:  0   nitroGLYCERIN (NITROSTAT) 0.4 MG SL tablet    Sig: As needed Take NTG Place 1 tablet (0.4 mg total) under the tongue every 5 (five)  minutes as needed for chest pain. Max 3 tablets. If chest pain is not relieved after 3 tablets seek urgent medical attention.    Dispense:  25 tablet    Refill:  0    Patient Instructions  Medication Instructions:   nitroGLYCERIN (NITROSTAT) 0.4 MG SL tablet -  Place 1 tablet (0.4 mg total) under the tongue every 5 (five) minutes as needed for chest pain. Max 3 tablets. If chest pain is not relieved after 3 tablets seek urgent medical attention  *If you need a refill on your cardiac medications before your next appointment, please call your pharmacy*   Lab Work:  None Ordered  If you have labs (blood work) drawn today and your tests are completely normal, you will receive your results only by: [place] (if you have MyChart) OR A paper copy in the mail If you have any lab test that is abnormal or we need to change your treatment, we will call you to review the results.   Testing/Procedures:  Your physician has requested that you have an echocardiogram. Echocardiography is a painless test that uses sound waves to create images of your heart. It provides your doctor with information about the size and shape of your heart and how well your heart's chambers and valves are working. This procedure takes approximately one hour. There are no restrictions for this procedure. Please do NOT wear cologne, perfume, aftershave, or lotions (deodorant is allowed). Please arrive 15 minutes prior to your appointment time.     Your cardiac CT will be scheduled on April  18th, 2024 @ 9:30am  [facility] [address] [place],  [address] [phone]  If scheduled at [facility] or [facility], please arrive 15 mins early for check-in and test prep.   Please follow these instructions carefully (unless otherwise directed):  On the Night Before the Test: Be sure to Drink plenty of water. Do not consume any caffeinated/decaffeinated beverages or chocolate 12 hours prior to your test. Do not take any antihistamines 12 hours prior to your test.   On the Day of the Test: Drink plenty of water until 1 hour prior to the test. Do not eat any food 1 hour prior to test. You may take your regular medications prior to the test.  Take metoprolol (Lopressor) two hours prior to test.  After the Test: Drink plenty of water. After receiving IV contrast, you may experience a mild flushed feeling. This is normal. On occasion, you may experience a mild rash up to 24 hours after the test. This is not dangerous. If this occurs, you can take Benadryl 25 mg and increase your fluid intake. If you experience trouble breathing, this can be serious. If it is severe call 911 IMMEDIATELY. If it is mild, please call our office. If you take any of these medications: Glipizide/Metformin, Avandament, Glucavance, please do not take 48 hours after completing test unless otherwise instructed.   Follow-Up: At [facility], you and your health needs are our priority.  As part of our continuing mission to provide you with exceptional heart care, we have created designated Provider Care Teams.  These Care Teams include your primary Cardiologist (physician) and Advanced Practice Providers (APPs -  Physician Assistants and Nurse Practitioners) who all work together to provide you with the care you need, when you need it.  We recommend signing up for the patient portal called "MyChart".   Sign up information  is provided on this After Visit Summary.  MyChart is used to connect with patients for Virtual Visits (Telemedicine).  Patients are able to view lab/test results, encounter notes, upcoming appointments, etc.  Non-urgent messages can be sent to your provider as well.   To learn more about what you can do with MyChart, go to [email or web address].    Your next appointment:    After testing  Provider:   You may see Kate Sable, MD or one of the following Advanced Practice Providers on your designated Care Team:   Murray Hodgkins, NP Christell Faith, PA-C Cadence Kathlen Mody, PA-C Gerrie Nordmann, NP   Signed, Kate Sable, MD  07/23/2022 10:13 AM    [place]

## 2022-07-23 NOTE — Patient Instructions (Signed)
Medication Instructions:   nitroGLYCERIN (NITROSTAT) 0.4 MG SL tablet -  Place 1 tablet (0.4 mg total) under the tongue every 5 (five) minutes as needed for chest pain. Max 3 tablets. If chest pain is not relieved after 3 tablets seek urgent medical attention  *If you need a refill on your cardiac medications before your next appointment, please call your pharmacy*   Lab Work:  None Ordered  If you have labs (blood work) drawn today and your tests are completely normal, you will receive your results only by: [place] (if you have MyChart) OR A paper copy in the mail If you have any lab test that is abnormal or we need to change your treatment, we will call you to review the results.   Testing/Procedures:  Your physician has requested that you have an echocardiogram. Echocardiography is a painless test that uses sound waves to create images of your heart. It provides your doctor with information about the size and shape of your heart and how well your heart's chambers and valves are working. This procedure takes approximately one hour. There are no restrictions for this procedure. Please do NOT wear cologne, perfume, aftershave, or lotions (deodorant is allowed). Please arrive 15 minutes prior to your appointment time.     Your cardiac CT will be scheduled on April 18th, 2024 @ 9:30am  [facility] [address] [place], [place] [address] [phone]  If scheduled at [facility] or [facility], please arrive 15 mins early for check-in and test prep.   Please follow these instructions carefully (unless otherwise directed):  On the Night Before the Test: Be sure to Drink plenty of water. Do not consume any caffeinated/decaffeinated beverages or chocolate 12 hours prior to your test. Do not take any antihistamines 12 hours prior to your test.   On the Day of the Test: Drink  plenty of water until 1 hour prior to the test. Do not eat any food 1 hour prior to test. You may take your regular medications prior to the test.  Take metoprolol (Lopressor) two hours prior to test.  After the Test: Drink plenty of water. After receiving IV contrast, you may experience a mild flushed feeling. This is normal. On occasion, you may experience a mild rash up to 24 hours after the test. This is not dangerous. If this occurs, you can take Benadryl 25 mg and increase your fluid intake. If you experience trouble breathing, this can be serious. If it is severe call 911 IMMEDIATELY. If it is mild, please call our office. If you take any of these medications: Glipizide/Metformin, Avandament, Glucavance, please do not take 48 hours after completing test unless otherwise instructed.   Follow-Up: At [facility], you and your health needs are our priority.  As part of our continuing mission to provide you with exceptional heart care, we have created designated Provider Care Teams.  These Care Teams include your primary Cardiologist (physician) and Advanced Practice Providers (APPs -  Physician Assistants and Nurse Practitioners) who all work together to provide you with the care you need, when you need it.  We recommend signing up for the patient portal called "MyChart".  Sign up information is provided on this After Visit Summary.  MyChart is used to connect with patients for Virtual Visits (Telemedicine).  Patients are able to view lab/test results, encounter notes, upcoming appointments, etc.  Non-urgent messages can be sent to your provider as  well.   To learn more about what you can do with MyChart, go to [email or web address].    Your next appointment:    After testing  Provider:   You may see Kate Sable, MD or one of the following Advanced Practice Providers on your designated Care Team:   Murray Hodgkins, NP Christell Faith, PA-C Cadence Kathlen Mody, PA-C Gerrie Nordmann, NP

## 2022-07-26 ENCOUNTER — Emergency Department: Payer: No Typology Code available for payment source

## 2022-07-26 ENCOUNTER — Other Ambulatory Visit: Payer: Self-pay

## 2022-07-26 ENCOUNTER — Encounter: Payer: Self-pay | Admitting: Emergency Medicine

## 2022-07-26 ENCOUNTER — Emergency Department
Admission: EM | Admit: 2022-07-26 | Discharge: 2022-07-26 | Disposition: A | Discharge status: Home / Self Care | Payer: No Typology Code available for payment source | Attending: Emergency Medicine | Admitting: Emergency Medicine

## 2022-07-26 DIAGNOSIS — R1011 Right upper quadrant pain: Secondary | ICD-10-CM | POA: Diagnosis not present

## 2022-07-26 DIAGNOSIS — D72829 Elevated white blood cell count, unspecified: Secondary | ICD-10-CM | POA: Diagnosis not present

## 2022-07-26 LAB — COMPREHENSIVE METABOLIC PANEL
ALT: 26 U/L (ref 0–44)
AST: 24 U/L (ref 15–41)
Albumin: 4.3 g/dL (ref 3.5–5.0)
Alkaline Phosphatase: 78 U/L (ref 38–126)
Anion gap: 8 (ref 5–15)
BUN: 9 mg/dL (ref 6–20)
CO2: 25 mmol/L (ref 22–32)
Calcium: 9.3 mg/dL (ref 8.9–10.3)
Chloride: 103 mmol/L (ref 98–111)
Creatinine, Ser: 0.74 mg/dL (ref 0.61–1.24)
GFR, Estimated: >60 mL/min (ref >60)
Glucose, Bld: 103 mg/dL — ABNORMAL HIGH (ref 70–99)
Potassium: 3.9 mmol/L (ref 3.5–5.1)
Sodium: 136 mmol/L (ref 135–145)
Total Bilirubin: 0.6 mg/dL (ref 0.3–1.2)
Total Protein: 7.4 g/dL (ref 6.5–8.1)

## 2022-07-26 LAB — LIPASE, BLOOD: Lipase: 27 U/L (ref 11–51)

## 2022-07-26 LAB — CBC
HCT: 47.3 % (ref 39.0–52.0)
Hemoglobin: 16.6 g/dL (ref 13.0–17.0)
MCH: 31.9 pg (ref 26.0–34.0)
MCHC: 35.1 g/dL (ref 30.0–36.0)
MCV: 90.8 fL (ref 80.0–100.0)
Platelets: 308 10*3/uL (ref 150–400)
RBC: 5.21 MIL/uL (ref 4.22–5.81)
RDW: 12.3 % (ref 11.5–15.5)
WBC: 12.8 10*3/uL — ABNORMAL HIGH (ref 4.0–10.5)
nRBC: 0 % (ref 0.0–0.2)

## 2022-07-26 LAB — URINALYSIS, ROUTINE W REFLEX MICROSCOPIC
Bilirubin Urine: NEGATIVE
Glucose, UA: NEGATIVE mg/dL
Hgb urine dipstick: NEGATIVE
Ketones, ur: NEGATIVE mg/dL
Leukocytes,Ua: NEGATIVE
Nitrite: NEGATIVE
Protein, ur: NEGATIVE mg/dL
Specific Gravity, Urine: 1.005 (ref 1.005–1.030)
pH: 6 (ref 5.0–8.0)

## 2022-07-26 MED ORDER — IOHEXOL 300 MG/ML  SOLN
100.0000 mL | Freq: Once | INTRAMUSCULAR | Status: AC | PRN
  Administered 2022-07-26: 100 mL via INTRAVENOUS

## 2022-07-26 MED ORDER — DICYCLOMINE HCL 10 MG PO CAPS: 10.0000 mg | ORAL_CAPSULE | Freq: Three times a day (TID) | ORAL | 0 refills | Status: DC

## 2022-07-26 MED ORDER — HYDROCODONE-ACETAMINOPHEN 5-325 MG PO TABS: 1.0000 | ORAL_TABLET | Freq: Four times a day (QID) | ORAL | 0 refills | Status: DC | PRN

## 2022-07-26 NOTE — ED Provider Notes (Signed)
[facility] Provider Note    Event Date/Time   First MD Initiated Contact with Patient 07/26/22 1045     (approximate)   History   Abdominal Pain   HPI  Tommy [name] is a 57 y.o. male with history of prediabetes presents emergency department complaint of right upper quadrant abdominal pain that started on Friday night.  Patient states worse after eating.  Only relieved by sitting still.  No vomiting, no nausea, no hematuria.  States normal bowel movements.  Patient does still have an appendix and his gallbladder.  Denies fever or chills.  No chest pain/shortness of breath.  States abdomen has gotten more tender over the last day.      Physical Exam   Triage Vital Signs: ED Triage Vitals  Enc Vitals Group     BP 07/26/22 1035 (!) 124/56     Pulse Rate 07/26/22 1035 (!) 55     Resp 07/26/22 1035 16     Temp 07/26/22 1037 98 F (36.7 C)     Temp src --      SpO2 07/26/22 1035 97 %     Weight 07/26/22 1037 195 lb (88.5 kg)     Height 07/26/22 1037 5\' 9"  (1.753 m)     Head Circumference --      Peak Flow --      Pain Score 07/26/22 1036 4     Pain Loc --      Pain Edu? --      Excl. in [place]? --     Most recent vital signs: Vitals:   07/26/22 1035 07/26/22 1037  BP: (!) 124/56   Pulse: (!) 55   Resp: 16   Temp:  98 F (36.7 C)  SpO2: 97%      General: Awake, no distress.   CV:  Good peripheral perfusion. regular rate and  rhythm Resp:  Normal effort. Lungs cta Abd:  No distention.  Right upper quadrant tender to palpation, bowel sounds normal all 4 quads, right lower quadrant is nontender Other:      ED Results / Procedures / Treatments   Labs (all labs ordered are listed, but only abnormal results are displayed) Labs Reviewed  COMPREHENSIVE METABOLIC PANEL - Abnormal; Notable for the following components:      Result Value   Glucose, Bld 103 (*)    All other components within normal limits  CBC - Abnormal; Notable for the  following components:   WBC 12.8 (*)    All other components within normal limits  URINALYSIS, ROUTINE W REFLEX MICROSCOPIC - Abnormal; Notable for the following components:   Color, Urine STRAW (*)    APPearance CLEAR (*)    All other components within normal limits  LIPASE, BLOOD     EKG     RADIOLOGY Ultrasound right upper quadrant    PROCEDURES:   Procedures   MEDICATIONS ORDERED IN ED: Medications  iohexol (OMNIPAQUE) 300 MG/ML solution 100 mL (100 mLs Intravenous Contrast Given 07/26/22 1242)     IMPRESSION / MDM / [place] / ED COURSE  I reviewed the triage vital signs and the nursing notes.                              Differential diagnosis includes, but is not limited to, acute cholecystitis, cholangitis, kidney stone, pancreatitis, bowel obstruction  Patient's presentation is most consistent with acute presentation with potential  threat to life or bodily function.   Labs and imaging ordered.  Ultrasound right upper quadrant.   Labs are reassuring, WBC has a small increase but not severe.  Ultrasound right upper quadrant independently reviewed and interpreted by me as being negative for any acute abnormality, radiologist does comment on some echodensities in the liver which are nonspecific.  CT abdomen/pelvis IV contrast was independently reviewed interpreted by me as being negative for any acute abnormality.  Once again the hypodensities were mentioned by the radiologist.  I did explain these findings to the patient.  Do not feel there is a reason to admit him.  He can follow-up with his regular doctor concerning the right upper quadrant pain and have repeat ultrasound or referral to gastroenterology.  Patient was given a prescription for Bentyl and for Vicodin.  He is to return emergency department if worsening.  Patient is in agreement treatment plan.  He was discharged stable condition.   FINAL CLINICAL IMPRESSION(S) / ED DIAGNOSES   Final  diagnoses:  RUQ abdominal pain     Rx / DC Orders   ED Discharge Orders          Ordered    HYDROcodone-acetaminophen (NORCO/VICODIN) 5-325 MG tablet  Every 6 hours PRN        07/26/22 1402    dicyclomine (BENTYL) 10 MG capsule  3 times daily before meals & bedtime        07/26/22 1402             Note:  This document was prepared using Dragon voice recognition software and may include unintentional dictation errors.    Versie Starks, PA-C 07/26/22 1405    Duffy Bruce, MD 07/26/22 2135

## 2022-07-26 NOTE — Discharge Instructions (Signed)
Follow-up with your doctor.  You should have your liver rechecked or evaluated by a gastroenterologist.  Dr. Netty Starring can refer you. Take the Bentyl to see if this helps with the cramping.  Tylenol and ibuprofen for pain as needed.  Vicodin for pain not controlled by these medications. Return to the emergency department if you are worsening

## 2022-07-26 NOTE — ED Triage Notes (Signed)
Pt to ED via [facility] for evaluation of RUQ abdominal pain that started on Friday night. Pt states that the pain is constant and is made worse by moving around. Pt reports pain is only relieved by staying still. Pt denies N/V or back pain. Pt is currently in NAD.

## 2022-07-29 ENCOUNTER — Other Ambulatory Visit: Payer: No Typology Code available for payment source

## 2022-08-10 ENCOUNTER — Telehealth (HOSPITAL_COMMUNITY): Payer: Self-pay | Admitting: Emergency Medicine

## 2022-08-10 NOTE — Telephone Encounter (Signed)
Attempted to call patient regarding upcoming cardiac CT appointment. °Left message on voicemail with name and callback number °Akasia Ahmad RN Navigator Cardiac Imaging °[facility] °[phone] Office °[phone] Cell ° °

## 2022-08-10 NOTE — Telephone Encounter (Signed)
Reaching out to patient to offer assistance regarding upcoming cardiac imaging study; pt verbalizes understanding of appt date/time, parking situation and where to check in, pre-test NPO status and medications ordered, and verified current allergies; name and call back number provided for further questions should they arise Rockwell Alexandria RN Navigator Cardiac Imaging [facility] [phone] office [phone] cell  Arrival [address] [facility]  metoprolol tartrate  Denies iv issues

## 2022-08-12 ENCOUNTER — Ambulatory Visit
Admission: RE | Admit: 2022-08-12 | Discharge: 2022-08-12 | Disposition: A | Discharge status: Home / Self Care | Payer: No Typology Code available for payment source | Source: Ambulatory Visit | Attending: Cardiology | Admitting: Cardiology

## 2022-08-12 DIAGNOSIS — R072 Precordial pain: Secondary | ICD-10-CM

## 2022-08-12 DIAGNOSIS — R079 Chest pain, unspecified: Secondary | ICD-10-CM | POA: Diagnosis not present

## 2022-08-12 MED ORDER — NITROGLYCERIN 0.4 MG SL SUBL
0.8000 mg | SUBLINGUAL_TABLET | Freq: Once | SUBLINGUAL | Status: AC
  Administered 2022-08-12: 0.8 mg via SUBLINGUAL

## 2022-08-12 MED ORDER — IOHEXOL 350 MG/ML SOLN
75.0000 mL | Freq: Once | INTRAVENOUS | Status: AC | PRN
  Administered 2022-08-12: 75 mL via INTRAVENOUS

## 2022-08-12 NOTE — Progress Notes (Signed)
Patient tolerated procedure well. Ambulate w/o difficulty. Denies light headedness or being dizzy. Sitting in chair drinking water provided. Encouraged to drink extra water today and reasoning explained. Verbalized understanding. All questions answered. ABC intact. No further needs. Discharge from procedure area w/o issues.   °

## 2022-08-17 ENCOUNTER — Telehealth: Payer: Self-pay

## 2022-08-17 MED ORDER — ASPIRIN 81 MG PO TBEC: 81.0000 mg | DELAYED_RELEASE_TABLET | Freq: Every day | ORAL | 3 refills | Status: DC

## 2022-08-17 MED ORDER — ATORVASTATIN CALCIUM 10 MG PO TABS: 10.0000 mg | ORAL_TABLET | Freq: Every day | ORAL | 3 refills | Status: DC

## 2022-08-17 NOTE — Telephone Encounter (Signed)
-----   Message from Debbe Odea, MD sent at 08/17/2022 10:33 AM EDT ----- Minimal nonobstructive CAD.  Smoking cessation recommended.  Start aspirin 81 mg, Lipitor 10 mg daily.  Low-cholesterol diet also advised.

## 2022-08-27 ENCOUNTER — Ambulatory Visit: Payer: No Typology Code available for payment source | Attending: Cardiology

## 2022-08-27 DIAGNOSIS — R072 Precordial pain: Secondary | ICD-10-CM

## 2022-08-28 LAB — ECHOCARDIOGRAM COMPLETE
AR max vel: 3.4 cm2
AV Area VTI: 3.44 cm2
AV Area mean vel: 3.33 cm2
AV Mean grad: 4 mmHg
AV Peak grad: 7.2 mmHg
Ao pk vel: 1.34 m/s
Area-P 1/2: 3.48 cm2
Calc EF: 50.4 %
S' Lateral: 4.3 cm
Single Plane A2C EF: 51.9 %
Single Plane A4C EF: 51.1 %

## 2022-09-01 ENCOUNTER — Ambulatory Visit: Payer: No Typology Code available for payment source | Attending: Cardiology | Admitting: Cardiology

## 2022-09-01 ENCOUNTER — Encounter: Payer: Self-pay | Admitting: Cardiology

## 2022-09-01 VITALS — BP 108/68 | HR 65 | Ht 69.0 in | Wt 192.0 lb

## 2022-09-01 DIAGNOSIS — F17200 Nicotine dependence, unspecified, uncomplicated: Secondary | ICD-10-CM | POA: Diagnosis not present

## 2022-09-01 DIAGNOSIS — I2510 Atherosclerotic heart disease of native coronary artery without angina pectoris: Secondary | ICD-10-CM | POA: Diagnosis not present

## 2022-09-01 DIAGNOSIS — I2584 Coronary atherosclerosis due to calcified coronary lesion: Secondary | ICD-10-CM

## 2022-09-01 MED ORDER — ASPIRIN 81 MG PO TBEC: 81.0000 mg | DELAYED_RELEASE_TABLET | Freq: Every day | ORAL | 3 refills | Status: DC

## 2022-09-01 MED ORDER — ATORVASTATIN CALCIUM 10 MG PO TABS: 10.0000 mg | ORAL_TABLET | Freq: Every day | ORAL | 3 refills | Status: DC

## 2022-09-01 NOTE — Progress Notes (Signed)
Cardiology Office Note:    Date:  09/01/2022   ID:  Tommy [name], DOB 1966-01-11, MRN [id number]  PCP:  Marisue Ivan, MD   [facility] Providers Cardiologist:  Debbe Odea, MD     Referring MD: Marisue Ivan, MD   Chief Complaint  Patient presents with   Follow-up    Discuss test results.  Patient denies new or acute cardiac problems/concerns today.  Has not started taking ASA 81 mg or Atorvastatin.       History of Present Illness:    Tommy [name] is a 57 y.o. male with a hx of DJD, prediabetes, current smoker x 30+ years presenting for follow up.  Was previously seen due to symptoms of chest pain.  Echocardiogram and coronary CTA was obtained.  He still smokes, overall states feeling well.  No new concerns at this time.  Presents for testing results.   Past Medical History:  Diagnosis Date   Allergy    Complication of anesthesia    took a while to wake up after right leg surgery   Prediabetes     Past Surgical History:  Procedure Laterality Date   COLONOSCOPY WITH PROPOFOL N/A 01/21/2020   Procedure: COLONOSCOPY WITH BIOPSY;  Surgeon: Midge Minium, MD;  Location: [facility];  Service: Endoscopy;  Laterality: N/A;  priority 4   FINGER FRACTURE SURGERY     pins in left first finger   POLYPECTOMY N/A 01/21/2020   Procedure: POLYPECTOMY;  Surgeon: Midge Minium, MD;  Location: [facility];  Service: Endoscopy;  Laterality: N/A;   TIBIA IM NAIL INSERTION Right 06/06/2015   Procedure: INTRAMEDULLARY (IM) NAIL TIBIAL;  Surgeon: Juanell Fairly, MD;  Location: [facility];  Service: Orthopedics;  Laterality: Right;   TONSILLECTOMY     WISDOM TOOTH EXTRACTION      Current Medications: Current Meds  Medication Sig   cetirizine (ZYRTEC) 10 MG tablet Take 10 mg by mouth daily.   Cholecalciferol (D-3-5 PO) Take 25 mg by mouth daily.   gabapentin (NEURONTIN) 300 MG capsule Take 600 mg by mouth at bedtime.   mometasone (NASONEX) 50  MCG/ACT nasal spray Place into the nose.   nitroGLYCERIN (NITROSTAT) 0.4 MG SL tablet As needed Take NTG Place 1 tablet (0.4 mg total) under the tongue every 5 (five) minutes as needed for chest pain. Max 3 tablets. If chest pain is not relieved after 3 tablets seek urgent medical attention.   Omega-3 Fatty Acids (FISH OIL) 1000 MG CAPS Take 2,000 mg by mouth daily.   vitamin B-12 (CYANOCOBALAMIN) 500 MCG tablet Take 500 mcg by mouth daily.     Allergies:   Patient has no known allergies.   Social History   Socioeconomic History   Marital status: Married    Spouse name: Not on file   Number of children: Not on file   Years of education: Not on file   Highest education level: Not on file  Occupational History   Not on file  Tobacco Use   Smoking status: Every Day    Packs/day: 1.00    Years: 40.00    Additional pack years: 0.00    Total pack years: 40.00    Types: Cigarettes   Smokeless tobacco: Never  Substance and Sexual Activity   Alcohol use: No   Drug use: No   Sexual activity: Yes  Other Topics Concern   Not on file  Social History Narrative   Not on file   Social Determinants of  Health   Financial Resource Strain: Not on file  Food Insecurity: Not on file  Transportation Needs: Not on file  Physical Activity: Not on file  Stress: Not on file  Social Connections: Not on file     Family History: The patient's family history includes Breast cancer in his maternal aunt and maternal aunt; Colon cancer in his maternal uncle; Diabetes in his father; Heart disease in his maternal grandfather; Kidney cancer in his maternal uncle; Liver disease in his brother; Lung cancer in his maternal uncle; Stomach cancer in his mother; Thyroid cancer in his mother.  ROS:   Please see the history of present illness.     All other systems reviewed and are negative.  EKGs/Labs/Other Studies Reviewed:    The following studies were reviewed today:   EKG:  EKG is  ordered today.   The ekg ordered today demonstrates normal sinus rhythm, normal ECG.  Recent Labs: 07/26/2022: ALT 26; BUN 9; Creatinine, Ser 0.74; Hemoglobin 16.6; Platelets 308; Potassium 3.9; Sodium 136  Recent Lipid Panel No results found for: "CHOL", "TRIG", "HDL", "CHOLHDL", "VLDL", "LDLCALC", "LDLDIRECT"  Plan repeat panel 11/21/2022, total cholesterol 200, triglycerides 228, HDL 40, LDL 114.  Risk Assessment/Calculations:             Physical Exam:    VS:  BP 108/68 (BP Location: Left Arm, Patient Position: Sitting, Cuff Size: Normal)   Pulse 65   Ht 5\' 9"  (1.753 m)   Wt 192 lb (87.1 kg)   SpO2 97%   BMI 28.35 kg/m     Wt Readings from Last 3 Encounters:  09/01/22 192 lb (87.1 kg)  07/26/22 195 lb (88.5 kg)  07/23/22 197 lb 6.4 oz (89.5 kg)     GEN:  Well nourished, well developed in no acute distress HEENT: Normal NECK: No JVD; No carotid bruits CARDIAC: RRR, no murmurs, rubs, gallops RESPIRATORY:  Clear to auscultation without rales, wheezing or rhonchi  ABDOMEN: Soft, non-tender, non-distended MUSCULOSKELETAL:  No edema; No deformity  SKIN: Warm and dry NEUROLOGIC:  Alert and oriented x 3 PSYCHIATRIC:  Normal affect   ASSESSMENT:    1. Coronary artery calcification   2. Smoking    PLAN:    In order of problems listed above:  Chest pain, echo EF 60 to 65%.  Coronary CT with minimal proximal LAD calcification (<25%), no significant stenosis.  Last LDL 114. start aspirin 81 mg daily, Lipitor 10 mg daily.  Consider repeat lipid panel in 3 months with PCP.  Titrate Lipitor if LDL not adequately controlled. Current smoker, smoking cessation advised.  Follow-up in 1 year.     Medication Adjustments/Labs and Tests Ordered: Current medicines are reviewed at length with the patient today.  Concerns regarding medicines are outlined above.  No orders of the defined types were placed in this encounter.  Meds ordered this encounter  Medications   aspirin EC 81 MG tablet     Sig: Take 1 tablet (81 mg total) by mouth daily. Swallow whole.    Dispense:  90 tablet    Refill:  3   atorvastatin (LIPITOR) 10 MG tablet    Sig: Take 1 tablet (10 mg total) by mouth daily.    Dispense:  90 tablet    Refill:  3    Patient Instructions   Medication Instructions:   START Aspirin 81 - take one tablet ( 81mg ) daily.  2. START Atorvastatin - Take one tablet ( 10mg ) by mouth daily.  *If you  need a refill on your cardiac medications before your next appointment, please call your pharmacy*   Lab Work:  None Ordered  If you have labs (blood work) drawn today and your tests are completely normal, you will receive your results only by: MyChart Message (if you have MyChart) OR A paper copy in the mail If you have any lab test that is abnormal or we need to change your treatment, we will call you to review the results.   Testing/Procedures:  None Ordered   Follow-Up: At [facility], you and your health needs are our priority.  As part of our continuing mission to provide you with exceptional heart care, we have created designated Provider Care Teams.  These Care Teams include your primary Cardiologist (physician) and Advanced Practice Providers (APPs -  Physician Assistants and Nurse Practitioners) who all work together to provide you with the care you need, when you need it.  We recommend signing up for the patient portal called "MyChart".  Sign up information is provided on this After Visit Summary.  MyChart is used to connect with patients for Virtual Visits (Telemedicine).  Patients are able to view lab/test results, encounter notes, upcoming appointments, etc.  Non-urgent messages can be sent to your provider as well.   To learn more about what you can do with MyChart, go to [email or web address].    Your next appointment:   12 month(s)  Provider:   You may see Debbe Odea, MD or one of the following Advanced Practice Providers on your  designated Care Team:   Nicolasa Ducking, NP Eula Listen, PA-C Cadence Fransico Michael, PA-C Charlsie Quest, NP    Signed, Debbe Odea, MD  09/01/2022 10:41 AM    [facility]

## 2022-09-01 NOTE — Patient Instructions (Signed)
  Medication Instructions:   START Aspirin 81 - take one tablet ( 81mg ) daily.  2. START Atorvastatin - Take one tablet ( 10mg ) by mouth daily.  *If you need a refill on your cardiac medications before your next appointment, please call your pharmacy*   Lab Work:  None Ordered  If you have labs (blood work) drawn today and your tests are completely normal, you will receive your results only by: MyChart Message (if you have MyChart) OR A paper copy in the mail If you have any lab test that is abnormal or we need to change your treatment, we will call you to review the results.   Testing/Procedures:  None Ordered   Follow-Up: At [facility], you and your health needs are our priority.  As part of our continuing mission to provide you with exceptional heart care, we have created designated Provider Care Teams.  These Care Teams include your primary Cardiologist (physician) and Advanced Practice Providers (APPs -  Physician Assistants and Nurse Practitioners) who all work together to provide you with the care you need, when you need it.  We recommend signing up for the patient portal called "MyChart".  Sign up information is provided on this After Visit Summary.  MyChart is used to connect with patients for Virtual Visits (Telemedicine).  Patients are able to view lab/test results, encounter notes, upcoming appointments, etc.  Non-urgent messages can be sent to your provider as well.   To learn more about what you can do with MyChart, go to [email or web address].    Your next appointment:   12 month(s)  Provider:   You may see Debbe Odea, MD or one of the following Advanced Practice Providers on your designated Care Team:   Nicolasa Ducking, NP Eula Listen, PA-C Cadence Fransico Michael, PA-C Charlsie Quest, NP

## 2023-02-22 ENCOUNTER — Telehealth: Payer: Self-pay | Admitting: Cardiology

## 2023-02-22 NOTE — Telephone Encounter (Signed)
Spoke to patient and informed him that the Rx was sent to the pharmacy of choice with 90 day and 3 refills (1 year supply). Patient understood with read back

## 2023-02-22 NOTE — Telephone Encounter (Signed)
*  STAT* If patient is at the pharmacy, call can be transferred to refill team.   1. Which medications need to be refilled? (please list name of each medication and dose if known) atorvastatin (LIPITOR) 10 MG tablet   2. Which pharmacy/location (including street and city if local pharmacy) is medication to be sent to?[facility] - [place], [place] - [address]   3. Do they need a 30 day or 90 day supply? 90

## 2023-09-06 ENCOUNTER — Telehealth: Payer: Self-pay | Admitting: Cardiology

## 2023-09-06 MED ORDER — ATORVASTATIN CALCIUM 10 MG PO TABS: 10.0000 mg | ORAL_TABLET | Freq: Every day | ORAL | 0 refills | Status: DC

## 2023-09-06 NOTE — Telephone Encounter (Signed)
*  STAT* If patient is at the pharmacy, call can be transferred to refill team.   1. Which medications need to be refilled? (please list name of each medication and dose if known)   atorvastatin  (LIPITOR) 10 MG tablet (Expired)    Take 1 tablet (10 mg total) by mouth daily.     2. Would you like to learn more about the convenience, safety, & potential cost savings by using the [facility]?No   3. Are you open to using the [facility] No   4. Which pharmacy/location (including street and city if local pharmacy) is medication to be sent to? [facility] - [place], [place] - [address]     5. Do they need a 30 day or 90 day supply? 90 Day Supply

## 2023-09-06 NOTE — Telephone Encounter (Signed)
 RX sent to requested Pharmacy

## 2023-10-07 ENCOUNTER — Ambulatory Visit: Attending: Cardiology | Admitting: Cardiology

## 2023-10-07 ENCOUNTER — Encounter: Payer: Self-pay | Admitting: Cardiology

## 2023-10-07 VITALS — BP 125/75 | HR 69 | Ht 69.0 in | Wt 198.8 lb

## 2023-10-07 DIAGNOSIS — I2510 Atherosclerotic heart disease of native coronary artery without angina pectoris: Secondary | ICD-10-CM | POA: Diagnosis not present

## 2023-10-07 DIAGNOSIS — F17200 Nicotine dependence, unspecified, uncomplicated: Secondary | ICD-10-CM | POA: Diagnosis not present

## 2023-10-07 NOTE — Progress Notes (Signed)
 Cardiology Office Note:    Date:  10/07/2023   ID:  Tommy [name], DOB 1965/07/09, MRN [id number]  PCP:  Monique Ano, MD   [facility] Providers Cardiologist:  Constancia Delton, MD     Referring MD: Monique Ano, MD   Chief Complaint  Patient presents with   Follow-up    1 year follow up     History of Present Illness:    Tommy [name] is a 58 y.o. male with a hx of coronary calcification, prediabetes, current smoker x 30+ years presenting for follow up.    Previously seen due to coronary calcifications.  Workup with CCTA showed minimal nonobstructive CAD.  Started on aspirin , Lipitor.  Most recent lipid panel with well-controlled cholesterol levels.  LDL at goal.  He still smokes, is working on quitting.  Patient stopped taking aspirin .  Unsure why.  Prior notes/testing Echo 08/2022 EF 60 to 65% CCTA 07/2022 calcium  score 23.3, minimal LAD disease.  Past Medical History:  Diagnosis Date   Allergy    Complication of anesthesia    took a while to wake up after right leg surgery   Prediabetes     Past Surgical History:  Procedure Laterality Date   COLONOSCOPY WITH PROPOFOL  N/A 01/21/2020   Procedure: COLONOSCOPY WITH BIOPSY;  Surgeon: Marnee Sink, MD;  Location: [facility];  Service: Endoscopy;  Laterality: N/A;  priority 4   FINGER FRACTURE SURGERY     pins in left first finger   POLYPECTOMY N/A 01/21/2020   Procedure: POLYPECTOMY;  Surgeon: Marnee Sink, MD;  Location: [facility];  Service: Endoscopy;  Laterality: N/A;   TIBIA IM NAIL INSERTION Right 06/06/2015   Procedure: INTRAMEDULLARY (IM) NAIL TIBIAL;  Surgeon: Rande Bushy, MD;  Location: [facility];  Service: Orthopedics;  Laterality: Right;   TONSILLECTOMY     WISDOM TOOTH EXTRACTION      Current Medications: Current Meds  Medication Sig   atorvastatin  (LIPITOR) 10 MG tablet Take 1 tablet (10 mg total) by mouth daily.   Cholecalciferol (D-3-5 PO) Take 25 mg by  mouth daily.   gabapentin (NEURONTIN) 300 MG capsule Take 600 mg by mouth at bedtime.   Omega-3 Fatty Acids (FISH OIL) 1000 MG CAPS Take 2,000 mg by mouth daily.   vitamin B-12 (CYANOCOBALAMIN) 500 MCG tablet Take 500 mcg by mouth daily.     Allergies:   Patient has no known allergies.   Social History   Socioeconomic History   Marital status: Married    Spouse name: Not on file   Number of children: Not on file   Years of education: Not on file   Highest education level: Not on file  Occupational History   Not on file  Tobacco Use   Smoking status: Every Day    Current packs/day: 1.00    Average packs/day: 1 pack/day for 40.0 years (40.0 ttl pk-yrs)    Types: Cigarettes   Smokeless tobacco: Never  Substance and Sexual Activity   Alcohol use: No   Drug use: No   Sexual activity: Yes  Other Topics Concern   Not on file  Social History Narrative   Not on file   Social Drivers of Health   Financial Resource Strain: Low Risk  (07/07/2023)   Received from [facility]   Overall Financial Resource Strain (CARDIA)    Difficulty of Paying Living Expenses: Not hard at all  Food Insecurity: No Food Insecurity (07/07/2023)   Received from [facility]   Hunger Vital Sign    Within the past 12 months, you worried that your food would run out before you got the money to buy more.: Never true    Within the past 12 months, the food you bought just didn't last and you didn't have money to get more.: Never true  Transportation Needs: No Transportation Needs (07/07/2023)   Received from [facility] - Transportation    In the past 12 months, has lack of transportation kept you from medical appointments or from getting medications?: No    Lack of Transportation (Non-Medical): No  Physical Activity: Not on file  Stress: Not on file  Social Connections: Not on file     Family History: The patient's family history includes  Breast cancer in his maternal aunt and maternal aunt; Colon cancer in his maternal uncle; Diabetes in his father; Heart disease in his maternal grandfather; Kidney cancer in his maternal uncle; Liver disease in his brother; Lung cancer in his maternal uncle; Stomach cancer in his mother; Thyroid cancer in his mother.  ROS:   Please see the history of present illness.     All other systems reviewed and are negative.  EKGs/Labs/Other Studies Reviewed:    The following studies were reviewed today:   EKG Interpretation Date/Time:  Friday October 07 2023 13:44:39 EDT Ventricular Rate:  69 PR Interval:  148 QRS Duration:  92 QT Interval:  392 QTC Calculation: 420 R Axis:   78  Text Interpretation: Normal sinus rhythm Normal ECG Confirmed by Constancia Delton ([id number]) on 10/07/2023 1:46:40 PM    Recent Labs: No results found for requested labs within last 365 days.  Recent Lipid Panel No results found for: CHOL, TRIG, HDL, CHOLHDL, VLDL, LDLCALC, LDLDIRECT  Lipid panel 06/2023 total cholesterol 146, triglycerides 189, LDL 66, HDL 42.  Risk Assessment/Calculations:             Physical Exam:    VS:  BP 125/75 (BP Location: Left Arm, Patient Position: Sitting, Cuff Size: Normal)   Pulse 69   Ht 5' 9 (1.753 m)   Wt 198 lb 12.8 oz (90.2 kg)   SpO2 98%   BMI 29.36 kg/m     Wt Readings from Last 3 Encounters:  10/07/23 198 lb 12.8 oz (90.2 kg)  09/01/22 192 lb (87.1 kg)  07/26/22 195 lb (88.5 kg)     GEN:  Well nourished, well developed in no acute distress HEENT: Normal NECK: No JVD; No carotid bruits CARDIAC: RRR, no murmurs, rubs, gallops RESPIRATORY:  Clear to auscultation without rales, wheezing or rhonchi  ABDOMEN: Soft, non-tender, non-distended MUSCULOSKELETAL:  No edema; No deformity  SKIN: Warm and dry NEUROLOGIC:  Alert and oriented x 3 PSYCHIATRIC:  Normal affect   ASSESSMENT:    1. Coronary artery calcification   2. Current smoker    PLAN:     In order of problems listed above:  Coronary CT 07/2022 with minimal proximal LAD calcification (<25%), no significant stenosis.  Cholesterol controlled, LDL at goal.  Advised to restart aspirin  81 mg daily due to current smoking history.  Continue Lipitor 10 mg daily.  Current smoker, smoking cessation advised.  Follow-up in 1 year.     Medication Adjustments/Labs and Tests Ordered: Current medicines are reviewed at length with the patient today.  Concerns regarding medicines are outlined above.  Orders Placed This Encounter  Procedures   EKG 12-Lead   No orders of the  defined types were placed in this encounter.   Patient Instructions  Medication Instructions:  Your physician recommends that you continue on your current medications as directed. Please refer to the Current Medication list given to you today.    *If you need a refill on your cardiac medications before your next appointment, please call your pharmacy*  Lab Work: No labs ordered today  If you have labs (blood work) drawn today and your tests are completely normal, you will receive your results only by: MyChart Message (if you have MyChart) OR A paper copy in the mail If you have any lab test that is abnormal or we need to change your treatment, we will call you to review the results.  Testing/Procedures: No test ordered today   Follow-Up: At [facility], you and your health needs are our priority.  As part of our continuing mission to provide you with exceptional heart care, our providers are all part of one team.  This team includes your primary Cardiologist (physician) and Advanced Practice Providers or APPs (Physician Assistants and Nurse Practitioners) who all work together to provide you with the care you need, when you need it.  Your next appointment:   1 year(s)  Provider:   You may see Constancia Delton, MD or one of the following Advanced Practice Providers on your designated Care Team:    Laneta Pintos, NP Gildardo Labrador, PA-C Varney Gentleman, PA-C Cadence Springfield, PA-C Ronald Cockayne, NP Morey Ar, NP    We recommend signing up for the patient portal called MyChart.  Sign up information is provided on this After Visit Summary.  MyChart is used to connect with patients for Virtual Visits (Telemedicine).  Patients are able to view lab/test results, encounter notes, upcoming appointments, etc.  Non-urgent messages can be sent to your provider as well.   To learn more about what you can do with MyChart, go to [email or web address].          Signed, Constancia Delton, MD  10/07/2023 4:47 PM    [facility]

## 2023-10-07 NOTE — Patient Instructions (Signed)

## 2023-10-10 ENCOUNTER — Telehealth: Payer: Self-pay | Admitting: Cardiology

## 2023-10-10 ENCOUNTER — Emergency Department

## 2023-10-10 ENCOUNTER — Other Ambulatory Visit: Payer: Self-pay

## 2023-10-10 ENCOUNTER — Other Ambulatory Visit: Payer: Self-pay | Admitting: Cardiology

## 2023-10-10 DIAGNOSIS — Z720 Tobacco use: Secondary | ICD-10-CM | POA: Diagnosis not present

## 2023-10-10 DIAGNOSIS — R0602 Shortness of breath: Secondary | ICD-10-CM | POA: Diagnosis not present

## 2023-10-10 DIAGNOSIS — R0789 Other chest pain: Secondary | ICD-10-CM | POA: Insufficient documentation

## 2023-10-10 LAB — CBC
HCT: 41.1 % (ref 39.0–52.0)
Hemoglobin: 14.8 g/dL (ref 13.0–17.0)
MCH: 32.7 pg (ref 26.0–34.0)
MCHC: 36 g/dL (ref 30.0–36.0)
MCV: 90.7 fL (ref 80.0–100.0)
Platelets: 266 10*3/uL (ref 150–400)
RBC: 4.53 MIL/uL (ref 4.22–5.81)
RDW: 12.3 % (ref 11.5–15.5)
WBC: 11.1 10*3/uL — ABNORMAL HIGH (ref 4.0–10.5)
nRBC: 0 % (ref 0.0–0.2)

## 2023-10-10 LAB — TROPONIN I (HIGH SENSITIVITY): Troponin I (High Sensitivity): 4 ng/L (ref <18)

## 2023-10-10 LAB — BASIC METABOLIC PANEL WITH GFR
Anion gap: 8 (ref 5–15)
BUN: 10 mg/dL (ref 6–20)
CO2: 23 mmol/L (ref 22–32)
Calcium: 9.4 mg/dL (ref 8.9–10.3)
Chloride: 108 mmol/L (ref 98–111)
Creatinine, Ser: 0.75 mg/dL (ref 0.61–1.24)
GFR, Estimated: >60 mL/min (ref >60)
Glucose, Bld: 103 mg/dL — ABNORMAL HIGH (ref 70–99)
Potassium: 3.4 mmol/L — ABNORMAL LOW (ref 3.5–5.1)
Sodium: 139 mmol/L (ref 135–145)

## 2023-10-10 MED ORDER — ATORVASTATIN CALCIUM 10 MG PO TABS: 10.0000 mg | ORAL_TABLET | Freq: Every day | ORAL | 3 refills | Status: AC

## 2023-10-10 NOTE — Telephone Encounter (Signed)
 RX sent to requested Pharmacy

## 2023-10-10 NOTE — ED Triage Notes (Signed)
 Pt to ED via POV c/o CP and SOB. Pt reports woke up at 2am this morning with pain to center of chest and felt like he couldn't breathe. Pain got worse this evening. Denies dizziness, fevers, N/V

## 2023-10-10 NOTE — Telephone Encounter (Signed)
*  STAT* If patient is at the pharmacy, call can be transferred to refill team.   1. Which medications need to be refilled? (please list name of each medication and dose if known)   atorvastatin  (LIPITOR) 10 MG tablet    4. Which pharmacy/location (including street and city if local pharmacy) is medication to be sent to?  [facility] - [place], [place] - [address]     5. Do they need a 30 day or 90 day supply? 90

## 2023-10-10 NOTE — ED Notes (Signed)
Blue top sent to lab with save label 

## 2023-10-11 ENCOUNTER — Emergency Department
Admission: EM | Admit: 2023-10-11 | Discharge: 2023-10-11 | Disposition: A | Discharge status: Home / Self Care | Attending: Emergency Medicine | Admitting: Emergency Medicine

## 2023-10-11 DIAGNOSIS — R079 Chest pain, unspecified: Secondary | ICD-10-CM

## 2023-10-11 LAB — TROPONIN I (HIGH SENSITIVITY): Troponin I (High Sensitivity): 5 ng/L (ref <18)

## 2023-10-11 NOTE — ED Notes (Signed)
 No answer when called several times from lobby

## 2023-10-11 NOTE — ED Provider Notes (Signed)
 [facility] Provider Note    Event Date/Time   First MD Initiated Contact with Patient 10/11/23 0330     (approximate)   History   Chest Pain   HPI  Tommy [name] is a 58 y.o. male with a history of tobacco use, hyperlipidemia, prediabetes who presents to the emergency department with chest pressure, shortness of breath.  States symptoms woke him up from sleep and he felt like he was gasping for breath.  He has had similar intermittent symptoms for weeks.  Denies fevers, cough.  No history of PE, DVT, exogenous estrogen use, recent fractures, surgery, trauma, hospitalization, prolonged travel or other immobilization. No lower extremity swelling or pain. No calf tenderness.  Reports symptoms have improved.  He has a cardiologist and PCP for follow-up.  He has not had a sleep study.  No history of CHF.   History provided by patient.    Past Medical History:  Diagnosis Date   Allergy    Complication of anesthesia    took a while to wake up after right leg surgery   Prediabetes     Past Surgical History:  Procedure Laterality Date   COLONOSCOPY WITH PROPOFOL  N/A 01/21/2020   Procedure: COLONOSCOPY WITH BIOPSY;  Surgeon: Marnee Sink, MD;  Location: [facility];  Service: Endoscopy;  Laterality: N/A;  priority 4   FINGER FRACTURE SURGERY     pins in left first finger   POLYPECTOMY N/A 01/21/2020   Procedure: POLYPECTOMY;  Surgeon: Marnee Sink, MD;  Location: [facility];  Service: Endoscopy;  Laterality: N/A;   TIBIA IM NAIL INSERTION Right 06/06/2015   Procedure: INTRAMEDULLARY (IM) NAIL TIBIAL;  Surgeon: Rande Bushy, MD;  Location: [facility];  Service: Orthopedics;  Laterality: Right;   TONSILLECTOMY     WISDOM TOOTH EXTRACTION      MEDICATIONS:  Prior to Admission medications   Medication Sig Start Date End Date Taking? Authorizing Provider  atorvastatin  (LIPITOR) 10 MG tablet Take 1 tablet (10 mg total) by mouth daily. 10/10/23    Constancia Delton, MD  Cholecalciferol (D-3-5 PO) Take 25 mg by mouth daily.    [provider]  gabapentin (NEURONTIN) 300 MG capsule Take 600 mg by mouth at bedtime.    [provider]  Omega-3 Fatty Acids (FISH OIL) 1000 MG CAPS Take 2,000 mg by mouth daily.    [provider]  vitamin B-12 (CYANOCOBALAMIN) 500 MCG tablet Take 500 mcg by mouth daily.    [provider]    Physical Exam   Triage Vital Signs: ED Triage Vitals  Encounter Vitals Group     BP 10/10/23 2208 137/79     Girls Systolic BP Percentile --      Girls Diastolic BP Percentile --      Boys Systolic BP Percentile --      Boys Diastolic BP Percentile --      Pulse Rate 10/10/23 2208 61     Resp 10/10/23 2208 18     Temp 10/10/23 2208 98.3 F (36.8 C)     Temp Source 10/10/23 2208 Oral     SpO2 10/10/23 2208 97 %     Weight 10/10/23 2204 195 lb (88.5 kg)     Height 10/10/23 2204 5' 9 (1.753 m)     Head Circumference --      Peak Flow --      Pain Score 10/10/23 2204 4     Pain Loc --  Pain Education --      Exclude from Growth Chart --     Most recent vital signs: Vitals:   10/11/23 0144 10/11/23 0420  BP: 134/79 124/72  Pulse: (!) 55 (!) 56  Resp: 18 18  Temp: 98 F (36.7 C)   SpO2: 98% 95%    CONSTITUTIONAL: Alert, responds appropriately to questions. Well-appearing; well-nourished HEAD: Normocephalic, atraumatic EYES: Conjunctivae clear, pupils appear equal, sclera nonicteric ENT: normal nose; moist mucous membranes NECK: Supple, normal ROM CARD: RRR; S1 and S2 appreciated RESP: Normal chest excursion without splinting or tachypnea; breath sounds clear and equal bilaterally; no wheezes, no rhonchi, no rales, no hypoxia or respiratory distress, speaking full sentences ABD/GI: Non-distended; soft, non-tender, no rebound, no guarding, no peritoneal signs BACK: The back appears normal EXT: Normal ROM in all joints; no deformity noted, no edema, no calf  tenderness or calf swelling SKIN: Normal color for age and race; warm; no rash on exposed skin NEURO: Moves all extremities equally, normal speech PSYCH: The patient's mood and manner are appropriate.   ED Results / Procedures / Treatments   LABS: (all labs ordered are listed, but only abnormal results are displayed) Labs Reviewed  BASIC METABOLIC PANEL WITH GFR - Abnormal; Notable for the following components:      Result Value   Potassium 3.4 (*)    Glucose, Bld 103 (*)    All other components within normal limits  CBC - Abnormal; Notable for the following components:   WBC 11.1 (*)    All other components within normal limits  TROPONIN I (HIGH SENSITIVITY)  TROPONIN I (HIGH SENSITIVITY)     EKG:  EKG Interpretation Date/Time:  Monday October 10 2023 22:13:06 EDT Ventricular Rate:  56 PR Interval:  158 QRS Duration:  90 QT Interval:  404 QTC Calculation: 389 R Axis:   87  Text Interpretation: Sinus bradycardia Otherwise normal ECG When compared with ECG of 07-Oct-2023 13:44, No significant change was found Confirmed by Verneda Golder [id number]) on 10/11/2023 3:52:18 AM         RADIOLOGY: My personal review and interpretation of imaging: Chest x-ray clear.  I have personally reviewed all radiology reports.   DG Chest 2 View Result Date: 10/10/2023 CLINICAL DATA:  Chest pain, short of breath EXAM: CHEST - 2 VIEW COMPARISON:  05/25/2022 FINDINGS: Frontal and lateral views of the chest demonstrate an unremarkable cardiac silhouette. No acute airspace disease, effusion, or pneumothorax. No acute bony abnormalities. IMPRESSION: 1. No acute intrathoracic process. Electronically Signed   By: Bobbye Burrow M.D.   On: 10/10/2023 22:46     PROCEDURES:  Critical Care performed: No    Procedures    IMPRESSION / MDM / ASSESSMENT AND PLAN / ED COURSE  I reviewed the triage vital signs and the nursing notes.    Patient here with chest pain, shortness of breath that comes  on with sleeping.  Has not had a sleep study.    DIFFERENTIAL DIAGNOSIS (includes but not limited to):   Obstructive sleep apnea, ACS, doubt PE, dissection, pneumonia, pneumothorax, CHF   Patient's presentation is most consistent with acute presentation with potential threat to life or bodily function.   PLAN: EKG nonischemic.  Troponin x 2 negative.  Normal hemoglobin.  Chest x-ray reviewed and interpreted by myself and the radiologist and shows no pneumonia, edema, pneumothorax.  Patient is feeling better.  He is hemodynamically stable.  No risk factors for PE other than age.  Have recommended close follow-up  with his PCP as I feel he would benefit from a sleep study as his symptoms sound like they are related to obstructive sleep apnea and he agrees.  Will also refer him back to his cardiologist as well.  Patient comfortable with this plan.  Given reassuring workup here, I feel he is safe for discharge home.   MEDICATIONS GIVEN IN ED: Medications - No data to display   ED COURSE:  At this time, I do not feel there is any life-threatening condition present. I reviewed all nursing notes, vitals, pertinent previous records.  All lab and urine results, EKGs, imaging ordered have been independently reviewed and interpreted by myself.  I reviewed all available radiology reports from any imaging ordered this visit.  Based on my assessment, I feel the patient is safe to be discharged home without further emergent workup and can continue workup as an outpatient as needed. Discussed all findings, treatment plan as well as usual and customary return precautions.  They verbalize understanding and are comfortable with this plan.  Outpatient follow-up has been provided as needed.  All questions have been answered.    CONSULTS:  none   OUTSIDE RECORDS REVIEWED: Reviewed last cardiology note on 10/07/2023.       FINAL CLINICAL IMPRESSION(S) / ED DIAGNOSES   Final diagnoses:  Nonspecific chest  pain     Rx / DC Orders   ED Discharge Orders     None        Note:  This document was prepared using Dragon voice recognition software and may include unintentional dictation errors.   Meziah Blasingame, Clover Dao, DO 10/11/23 [phone]

## 2024-01-13 NOTE — Progress Notes (Signed)
 Chief Complaint  Patient presents with  . Follow-up    HPI  Tommy [name] is a 58 y.o. male here for f/u of chronic medical issues  Borderline DM: No acute issues.  No meds.  Asymptomatic.  Not checking CBGs.  HLD: No acute issues.  Tolerating medications without adverse effects.  Denies any myalgia.  No cp or CVA symptoms.   Cervical radiculopathy: No acute issues or changes in symptoms.  Tolerating gabapentin without adverse effects and states it remains effective.  Exertional dyspnea: New issue.  No chest pain or syncope associated.  Denies any peripheral edema.  Tobacco dependence: Still smoking more than 1/2 packs/day.  Has Chantix but has yet to start it.  ROS Review of systems is unremarkable for any active cardiac, respiratory, GI, GU, hematologic, neurologic, dermatologic, HEENT, or psychiatric symptoms except as noted above.  No fevers, chills, or constitutional symptoms.   Current Outpatient Medications  Medication Sig Dispense Refill  . acetaminophen  (TYLENOL ) 500 MG tablet Take 1,000 mg by mouth every 8 (eight) hours as needed    . ALLERGY RELIEF, CETIRIZINE, 10 mg tablet Take 10 mg by mouth once daily    . atorvastatin  (LIPITOR) 10 MG tablet Take 1 tablet by mouth once daily    . cholecalciferol 1000 unit tablet Take 1,000 Units by mouth once daily    . cyanocobalamin, vitamin B-12, (VITAMIN B12 ORAL) Take by mouth once daily daily    . docosahexaenoic acid/epa (FISH OIL ORAL) Take by mouth once daily 800 mg daily    . fluticasone propionate (FLONASE) 50 mcg/actuation nasal spray Place 2 sprays into both nostrils once daily as needed for Rhinitis    . gabapentin (NEURONTIN) 300 MG capsule Take 2 capsules (600 mg total) by mouth at bedtime 200 capsule 1  . ibuprofen (MOTRIN) 200 MG tablet Take 400 mg by mouth at bedtime As needed    . montelukast (SINGULAIR) 10 mg tablet Take 1 tablet (10 mg total) by mouth at bedtime 30 tablet 11  . nitroGLYcerin  (NITROSTAT ) 0.4 MG SL  tablet As needed Take NTG Place 1 tablet (0.4 mg total) under the tongue every 5 (five) minutes as needed for chest pain. Max 3 tablets. If chest pain is not relieved after 3 tablets seek urgent medical attention.     No current facility-administered medications for this visit.    Allergies as of 01/13/2024  . (No Known Allergies)    Patient Active Problem List  Diagnosis  . Tobacco dependence (>1/2 ppd; started at age 28)  . Hyperlipidemia, mixed (LDL 79 - 01/06/24)  . Borderline diabetes mellitus (A1c 5.7% - 01/06/24) - diet controlled  . Cervical radiculopathy  . 10 year risk of MI or stroke 7.5% or greater - followed by Dr. Darliss  . Environmental allergies    Past Medical History:  Diagnosis Date  . Benign essential HTN 04/12/2018  . Hyperlipidemia, mixed 04/12/2018  . Situational anxiety 06/21/2019  . Tobacco dependence (< 1/4 ppd; started at age 44)     Past Surgical History:  Procedure Laterality Date  . Right tib/fib repair s/p fracture  05/2015   Done by Dr. Krasinski  . FRACTURE SURGERY  06/06/2015   Broke right leg  . CATARACT EXTRACTION Bilateral 2021  . REPAIR & RECONSTRUCTION VOLAR PLATE FINGER Left    Index finger    Vitals:   01/13/24 1259  BP: 118/78  Pulse: 70  SpO2: 97%  Weight: 89.8 kg (198 lb)  Height: 175.3 cm (5'  9)  PainSc: 0-No pain   Body mass index is 29.24 kg/m.  Exam  General. Well appearing; NAD; VS reviewed     Eyes. Sclera and conjunctiva clear; Vision grossly intact; extraocular movements intact Neck. Supple.  Lungs. Respirations unlabored; clear to auscultation bilaterally; no wheezing Cardiovascular. Heart regular rate and rhythm without murmurs, gallops, or rubs Abdomen. Soft; non tender; non distended; no masses or organomegaly Extremities. no edema Skin. Normal color and turgor Neurologic. Alert and oriented x3; CN 2-12 grossly intact; no focal deficits  Assessment and Plan  1. Borderline diabetes mellitus (A1c  5.7% - 01/06/24) - diet controlled Stable off meds.  Focus on lifestyle modifications.  No need to check CBGs. -     Hemoglobin A1C; Future  2. Hyperlipidemia, mixed (LDL 79 - 01/06/24) Well controlled.  LFTs wnls (12/2023). Continue with current regimen.  Counseled on mediterranean diet and aerobic exercise. -     Comprehensive Metabolic Panel (CMP); Future -     Lipid Panel w/calc LDL; Future  3. Tobacco dependence (>1/2 ppd; started at age 70) Unchanged.  Smoking cessation counseling provided.  Patient reports he has Chantix at home and will start when he is ready. -     CBC w/auto Differential (5 Part); Future  4. Exertional dyspnea New.  Patient deferred on chest x-ray and echocardiogram.  Likely deconditioning.  Counseled on smoking cessation and increase exercise.  5. Cervical radiculopathy Stable.  Continue with gabapentin without changes to dosing.  Other orders -     Follow up in Primary Care -     gabapentin (NEURONTIN) 300 MG capsule; Take 2 capsules (600 mg total) by mouth at bedtime -     Follow up in Primary Care; Future  F/u 6 months for PE; labs prior  ALDA CARPEN, MD
